# Patient Record
Sex: Female | Born: 1990 | Hispanic: No | Marital: Married | Smoking: Never smoker
Health system: Southern US, Community
[De-identification: ages and names within clinical notes are randomized; demographics above are authoritative.]

## PROBLEM LIST (undated history)

## (undated) DIAGNOSIS — J45909 Unspecified asthma, uncomplicated: Secondary | ICD-10-CM

## (undated) DIAGNOSIS — R51 Headache: Secondary | ICD-10-CM

## (undated) HISTORY — DX: Headache: R51

## (undated) HISTORY — PX: TONSILLECTOMY: SUR1361

## (undated) HISTORY — PX: TONSILECTOMY, ADENOIDECTOMY, BILATERAL MYRINGOTOMY AND TUBES: SHX2538

---
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
## 2012-07-22 ENCOUNTER — Encounter (HOSPITAL_COMMUNITY): Payer: Self-pay | Admitting: Emergency Medicine

## 2012-07-22 ENCOUNTER — Emergency Department (HOSPITAL_COMMUNITY)
Admission: EM | Admit: 2012-07-22 | Discharge: 2012-07-22 | Disposition: A | Discharge status: Home / Self Care | Payer: BC Managed Care – PPO | Attending: Emergency Medicine | Admitting: Emergency Medicine

## 2012-07-22 DIAGNOSIS — R05 Cough: Secondary | ICD-10-CM | POA: Insufficient documentation

## 2012-07-22 DIAGNOSIS — J45901 Unspecified asthma with (acute) exacerbation: Secondary | ICD-10-CM | POA: Insufficient documentation

## 2012-07-22 DIAGNOSIS — J3489 Other specified disorders of nose and nasal sinuses: Secondary | ICD-10-CM | POA: Insufficient documentation

## 2012-07-22 DIAGNOSIS — R059 Cough, unspecified: Secondary | ICD-10-CM | POA: Insufficient documentation

## 2012-07-22 HISTORY — DX: Unspecified asthma, uncomplicated: J45.909

## 2012-07-22 MED ORDER — ALBUTEROL SULFATE (5 MG/ML) 0.5% IN NEBU
5.0000 mg | INHALATION_SOLUTION | Freq: Once | RESPIRATORY_TRACT | Status: AC
  Administered 2012-07-22: 5 mg via RESPIRATORY_TRACT
  Filled 2012-07-22: qty 1

## 2012-07-22 MED ORDER — ALBUTEROL SULFATE (5 MG/ML) 0.5% IN NEBU
5.0000 mg | INHALATION_SOLUTION | RESPIRATORY_TRACT | Status: AC
  Administered 2012-07-22 (×2): 5 mg via RESPIRATORY_TRACT
  Filled 2012-07-22 (×2): qty 1

## 2012-07-22 MED ORDER — IPRATROPIUM BROMIDE 0.02 % IN SOLN
0.5000 mg | Freq: Once | RESPIRATORY_TRACT | Status: AC
  Administered 2012-07-22: 0.5 mg via RESPIRATORY_TRACT
  Filled 2012-07-22: qty 2.5

## 2012-07-22 MED ORDER — PREDNISONE 20 MG PO TABS
60.0000 mg | ORAL_TABLET | Freq: Once | ORAL | Status: AC
  Administered 2012-07-22: 60 mg via ORAL
  Filled 2012-07-22: qty 3

## 2012-07-22 MED ORDER — ALPRAZOLAM 0.25 MG PO TABS
0.2500 mg | ORAL_TABLET | Freq: Once | ORAL | Status: AC
  Administered 2012-07-22: 0.25 mg via ORAL
  Filled 2012-07-22: qty 1

## 2012-07-22 MED ORDER — PREDNISONE 20 MG PO TABS: 40.0000 mg | ORAL_TABLET | Freq: Every day | ORAL | Status: DC

## 2012-07-22 MED ORDER — ALBUTEROL SULFATE HFA 108 (90 BASE) MCG/ACT IN AERS: 2.0000 | INHALATION_SPRAY | RESPIRATORY_TRACT | Status: DC | PRN

## 2012-07-22 NOTE — ED Notes (Signed)
JYN:[id number]<AO> Expected date:<BR> Expected time:<BR> Means of arrival:Ambulance<BR> Comments:<BR> asthma

## 2012-07-22 NOTE — ED Notes (Addendum)
PER EMS: Pt at school near cooking ovens, started hyperventilating. Cold since last Wednesday. Lungs clear no wheezing

## 2012-07-22 NOTE — ED Provider Notes (Signed)
History     CSN: [id number]  Arrival date & time 07/22/12  0902   First MD Initiated Contact with Patient 07/22/12 [phone]      Chief Complaint  Patient presents with  . Shortness of Breath    (Consider location/radiation/quality/duration/timing/severity/associated sxs/prior treatment) The history is provided by the patient.  Shelly [name] is a 21 y.o. female history of asthma here with shortness of breath. She has been having some nonproductive cough and sinus congestion for last 4 days. Today she was at [employer] school and was cooking and felt SOB. She felt that she couldn't breath. No hx of anxiety attacks. She felt like it was her asthma. Didn't have albuterol with her so she didn't use it. No hx of DVT/PE and no recent travels.    Past Medical History  Diagnosis Date  . Asthma     Past Surgical History  Procedure Date  . Tonsilectomy, adenoidectomy, bilateral myringotomy and tubes     No family history on file.  History  Substance Use Topics  . Smoking status: Never Smoker   . Smokeless tobacco: Not on file  . Alcohol Use: No    OB History    Grav Para Term Preterm Abortions TAB SAB Ect Mult Living                  Review of Systems  HENT: Positive for congestion and rhinorrhea.   Respiratory: Positive for cough.   All other systems reviewed and are negative.    Allergies  Review of patient's allergies indicates no known allergies.  Home Medications  No current outpatient prescriptions on file.  BP 112/69  Pulse 72  Temp 98 F (36.7 C) (Oral)  SpO2 100%  LMP 06/30/2012  Physical Exam  Nursing note and vitals reviewed. Constitutional: She is oriented to person, place, and time. She appears well-developed and well-nourished.       Calm   HENT:  Head: Normocephalic.  Mouth/Throat: Oropharynx is clear and moist.  Eyes: Conjunctivae normal are normal. Pupils are equal, round, and reactive to light.  Neck: Normal range of motion. Neck supple.   Cardiovascular: Normal rate, regular rhythm and normal heart sounds.   Pulmonary/Chest: Effort normal.       Slightly decreased air movements, no obvious wheezing. No retractions.   Abdominal: Soft. Bowel sounds are normal. She exhibits no distension. There is no tenderness. There is no rebound.  Musculoskeletal: Normal range of motion.  Neurological: She is alert and oriented to person, place, and time.  Skin: Skin is warm and dry.  Psychiatric: She has a normal mood and affect. Her behavior is normal. Judgment and thought content normal.    ED Course  Procedures (including critical care time)  Labs Reviewed - No data to display No results found.   No diagnosis found.   Date: 07/22/2012  Rate: 78  Rhythm: normal sinus rhythm  QRS Axis: normal  Intervals: borderline prolonged QT  ST/T Wave abnormalities: normal  Conduction Disutrbances:none  Narrative Interpretation:   Old EKG Reviewed: none available    MDM  Shelly [name] is a 20 y.o. female here with SOB likely mild asthma exacerbation. Will give a neb and reassess.   9:56 AM Still not feeling well. Will give steroids and albuterol.   10:58 AM Felt better, improved air movements. No wheezing. Will d/c home on course of prednisone and albuterol.       Richardean Canal, MD 07/22/12 1058

## 2013-01-24 ENCOUNTER — Emergency Department (HOSPITAL_COMMUNITY)
Admission: EM | Admit: 2013-01-24 | Discharge: 2013-01-25 | Disposition: A | Discharge status: Home / Self Care | Payer: Worker's Compensation | Attending: Emergency Medicine | Admitting: Emergency Medicine

## 2013-01-24 ENCOUNTER — Encounter (HOSPITAL_COMMUNITY): Payer: Self-pay | Admitting: *Deleted

## 2013-01-24 ENCOUNTER — Emergency Department (HOSPITAL_COMMUNITY): Payer: Worker's Compensation

## 2013-01-24 DIAGNOSIS — S8990XA Unspecified injury of unspecified lower leg, initial encounter: Secondary | ICD-10-CM | POA: Insufficient documentation

## 2013-01-24 DIAGNOSIS — Y9229 Other specified public building as the place of occurrence of the external cause: Secondary | ICD-10-CM | POA: Insufficient documentation

## 2013-01-24 DIAGNOSIS — Z79899 Other long term (current) drug therapy: Secondary | ICD-10-CM | POA: Insufficient documentation

## 2013-01-24 DIAGNOSIS — J45909 Unspecified asthma, uncomplicated: Secondary | ICD-10-CM | POA: Insufficient documentation

## 2013-01-24 DIAGNOSIS — Y990 Civilian activity done for income or pay: Secondary | ICD-10-CM | POA: Insufficient documentation

## 2013-01-24 DIAGNOSIS — W010XXA Fall on same level from slipping, tripping and stumbling without subsequent striking against object, initial encounter: Secondary | ICD-10-CM | POA: Insufficient documentation

## 2013-01-24 DIAGNOSIS — IMO0002 Reserved for concepts with insufficient information to code with codable children: Secondary | ICD-10-CM | POA: Insufficient documentation

## 2013-01-24 DIAGNOSIS — S8992XA Unspecified injury of left lower leg, initial encounter: Secondary | ICD-10-CM

## 2013-01-24 MED ORDER — HYDROCODONE-ACETAMINOPHEN 5-325 MG PO TABS: 1.0000 | ORAL_TABLET | Freq: Four times a day (QID) | ORAL | Status: DC | PRN

## 2013-01-24 MED ORDER — IBUPROFEN 800 MG PO TABS: 800.0000 mg | ORAL_TABLET | Freq: Three times a day (TID) | ORAL | Status: DC | PRN

## 2013-01-24 NOTE — ED Provider Notes (Signed)
History  This chart was scribed for Ebbie Ridge, PA-C by Ladona Ridgel Day, ED scribe. This patient was seen in room [facility]TR4/[facility]PT4 and the patient's care was started at 2135.   CSN: [id number]  Arrival date & time 01/24/13  2135   First MD Initiated Contact with Patient 01/24/13 2247      Chief Complaint  Patient presents with  . Knee Injury   The history is provided by the patient. No language interpreter was used.   HPI Comments: Shelly [name] is a 22 y.o. female who presents to the Emergency Department complaining of acute onset moderate left lower leg pain from knee to ankle after she slipped on water while at work this PM about 2 hours ago. She denies any other injuries.    Past Medical History  Diagnosis Date  . Asthma     Past Surgical History  Procedure Laterality Date  . Tonsilectomy, adenoidectomy, bilateral myringotomy and tubes      History reviewed. No pertinent family history.  History  Substance Use Topics  . Smoking status: Never Smoker   . Smokeless tobacco: Not on file  . Alcohol Use: No    OB History   Grav Para Term Preterm Abortions TAB SAB Ect Mult Living                  Review of Systems  Constitutional: Negative for fever and chills.  Respiratory: Negative for shortness of breath.   Gastrointestinal: Negative for nausea and vomiting.  Musculoskeletal:       Left lower leg pain  Neurological: Negative for weakness.  All other systems reviewed and are negative.   A complete 10 system review of systems was obtained and all systems are negative except as noted in the HPI and PMH.   Allergies  Review of patient's allergies indicates no known allergies.  Home Medications   Current Outpatient Rx  Name  Route  Sig  Dispense  Refill  . desogestrel-ethinyl estradiol (VIORELE) 0.15-0.02/0.01 MG (21/5) tablet   Oral   Take 1 tablet by mouth daily.         . mometasone (ASMANEX 120 METERED DOSES) 220 MCG/INH inhaler   Inhalation   Inhale 2  puffs into the lungs every other day.         . albuterol (PROVENTIL HFA;VENTOLIN HFA) 108 (90 BASE) MCG/ACT inhaler   Inhalation   Inhale 2 puffs into the lungs every 4 (four) hours as needed for wheezing.   1 Inhaler   0     Triage Vitals: BP 121/63  Pulse 73  Temp(Src) 98.3 F (36.8 C) (Oral)  Resp 20  Wt 201 lb 1.6 oz (91.218 kg)  SpO2 97%  LMP 01/08/2013  Physical Exam  Nursing note and vitals reviewed. Constitutional: She is oriented to person, place, and time. She appears well-developed and well-nourished. No distress.  HENT:  Head: Normocephalic and atraumatic.  Eyes: Pupils are equal, round, and reactive to light.  Pulmonary/Chest: Effort normal.  Musculoskeletal:       Left knee: She exhibits normal range of motion, no swelling, no effusion, no ecchymosis and no deformity. Tenderness found.       Left ankle: She exhibits normal range of motion, no swelling and no ecchymosis. Tenderness. Lateral malleolus and medial malleolus tenderness found.       Legs: Neurological: She is alert and oriented to person, place, and time.  Skin: Skin is warm and dry.    ED Course  Procedures (including  critical care time) DIAGNOSTIC STUDIES: Oxygen Saturation is 97% on room air, normal by my interpretation.    COORDINATION OF CARE: At 1045 PM Discussed treatment plan with patient which includes left tib/fib X-ray. Patient agrees.   Patient has negative films. Told to return here as needed. Ice and elevate the leg.   MDM  I personally performed the services described in this documentation, which was scribed in my presence. The recorded information has been reviewed and is accurate.          Carlyle Dolly, PA-C 01/24/13 2351

## 2013-01-24 NOTE — ED Notes (Signed)
Pt was at work and slipped in water injuring her left knee, the pain is 7/10 and it hurts all way to her ankle.  Pt is alert and oriented in NAD

## 2013-01-25 NOTE — ED Provider Notes (Signed)
Medical screening examination/treatment/procedure(s) were performed by non-physician practitioner and as supervising physician I was immediately available for consultation/collaboration.  Ethelda Chick, MD 01/25/13 0005

## 2013-08-16 ENCOUNTER — Encounter (HOSPITAL_COMMUNITY): Payer: Self-pay | Admitting: Emergency Medicine

## 2013-08-16 ENCOUNTER — Emergency Department (HOSPITAL_COMMUNITY): Payer: BC Managed Care – PPO

## 2013-08-16 ENCOUNTER — Emergency Department (HOSPITAL_COMMUNITY)
Admission: EM | Admit: 2013-08-16 | Discharge: 2013-08-17 | Disposition: A | Discharge status: Home / Self Care | Payer: BC Managed Care – PPO | Attending: Emergency Medicine | Admitting: Emergency Medicine

## 2013-08-16 DIAGNOSIS — Z791 Long term (current) use of non-steroidal anti-inflammatories (NSAID): Secondary | ICD-10-CM | POA: Insufficient documentation

## 2013-08-16 DIAGNOSIS — H538 Other visual disturbances: Secondary | ICD-10-CM

## 2013-08-16 DIAGNOSIS — H53149 Visual discomfort, unspecified: Secondary | ICD-10-CM | POA: Insufficient documentation

## 2013-08-16 DIAGNOSIS — J329 Chronic sinusitis, unspecified: Secondary | ICD-10-CM

## 2013-08-16 DIAGNOSIS — R51 Headache: Secondary | ICD-10-CM | POA: Insufficient documentation

## 2013-08-16 DIAGNOSIS — J45909 Unspecified asthma, uncomplicated: Secondary | ICD-10-CM | POA: Insufficient documentation

## 2013-08-16 DIAGNOSIS — T368X5A Adverse effect of other systemic antibiotics, initial encounter: Secondary | ICD-10-CM | POA: Insufficient documentation

## 2013-08-16 DIAGNOSIS — Z792 Long term (current) use of antibiotics: Secondary | ICD-10-CM | POA: Insufficient documentation

## 2013-08-16 LAB — POCT I-STAT, CHEM 8
BUN: 8 mg/dL (ref 6–23)
Calcium, Ion: 1.15 mmol/L (ref 1.12–1.23)
Chloride: 103 mEq/L (ref 96–112)
Creatinine, Ser: 1 mg/dL (ref 0.50–1.10)
Glucose, Bld: 92 mg/dL (ref 70–99)
HCT: 42 % (ref 36.0–46.0)
Hemoglobin: 14.3 g/dL (ref 12.0–15.0)
POTASSIUM: 3.9 meq/L (ref 3.7–5.3)
SODIUM: 140 meq/L (ref 137–147)
TCO2: 25 mmol/L (ref 0–100)

## 2013-08-16 MED ORDER — IOHEXOL 300 MG/ML  SOLN
100.0000 mL | Freq: Once | INTRAMUSCULAR | Status: AC | PRN
  Administered 2013-08-16: 100 mL via INTRAVENOUS

## 2013-08-16 MED ORDER — SODIUM CHLORIDE 0.9 % IV BOLUS (SEPSIS)
1000.0000 mL | Freq: Once | INTRAVENOUS | Status: AC
  Administered 2013-08-16: 1000 mL via INTRAVENOUS

## 2013-08-16 MED ORDER — AMOXICILLIN-POT CLAVULANATE 875-125 MG PO TABS: 1.0000 | ORAL_TABLET | Freq: Two times a day (BID) | ORAL | Status: DC

## 2013-08-16 NOTE — Discharge Instructions (Signed)
Please read and follow all provided instructions.  Your diagnoses today include:  1. Blurred vision, bilateral   2. Sinusitis     Tests performed today include:  Visual acuity testing to check your vision  CT scan to evaluate for any structural problems with the eye -- shows normal eyes, shows sinus infection  Vital signs. See below for your results today.   Medications prescribed:   Augmentin - antibiotic  You have been prescribed an antibiotic medicine: take the entire course of medicine even if you are feeling better. Stopping early can cause the antibiotic not to work.  Take any prescribed medications only as directed.  Home care instructions:  Follow any educational materials contained in this packet.   Follow-up instructions: Please follow-up with the opthalmologist listed in the next 2-3 days for further evaluation of your symptoms.  If you do not have a primary care doctor -- see below for referral information.   Return instructions:   Please return to the Emergency Department if you experience worsening symptoms.   Please return immediately if you develop severe pain, pus drainage, new change in vision, or fever.  Return with loss of vision or black areas in your vision  Please return if you have any other emergent concerns.  Additional Information:  Your vital signs today were: BP 133/71   Pulse 74   Temp(Src) 98.3 F (36.8 C) (Oral)   Resp 18   SpO2 100%   LMP 07/21/2013 If your blood pressure (BP) was elevated above 135/85 this visit, please have this repeated by your doctor within one month. ---------------

## 2013-08-16 NOTE — ED Provider Notes (Signed)
CSN: [id number]     Arrival date & time 08/16/13  1927 History   First MD Initiated Contact with Patient 08/16/13 1938     Chief Complaint  Patient presents with  . Allergic Reaction   (Consider location/radiation/quality/duration/timing/severity/associated sxs/prior Treatment) HPI Comments: Patient with no significant PMH presents with complaint of blurred vision, change in vision describing that everything "looks bright". Vision is changed in both eyes but worse in right. Patient states that she was given a prescription for levofloxacin for sinus infection 3 days ago. She has also been taking over-the-counter cold medication, naproxen, and a generic "migraine medication". She has had associated headaches which currently is nearly gone. She denies double vision, floaters, flashing lights. She denies hitting her head. She is a contact lens wearer. She took them out earlier but symptoms did not change. The onset of this condition was gradual. The course is gradually worsening. Aggravating factors: light. Alleviating factors: none.    Patient is a 23 y.o. female presenting with allergic reaction.  Allergic Reaction Presenting symptoms: no rash     Past Medical History  Diagnosis Date  . Asthma    Past Surgical History  Procedure Laterality Date  . Tonsilectomy, adenoidectomy, bilateral myringotomy and tubes     No family history on file. History  Substance Use Topics  . Smoking status: Never Smoker   . Smokeless tobacco: Not on file  . Alcohol Use: No   OB History   Grav Para Term Preterm Abortions TAB SAB Ect Mult Living                 Review of Systems  Constitutional: Negative for fever.  HENT: Positive for congestion. Negative for rhinorrhea and sore throat.   Eyes: Positive for photophobia and visual disturbance. Negative for pain, discharge, redness and itching.  Respiratory: Negative for cough.   Cardiovascular: Negative for chest pain.  Gastrointestinal: Negative for  nausea, vomiting, abdominal pain and diarrhea.  Genitourinary: Negative for dysuria.  Musculoskeletal: Negative for myalgias.  Skin: Negative for rash.  Neurological: Positive for headaches.    Allergies  Review of patient's allergies indicates no known allergies.  Home Medications   Current Outpatient Rx  Name  Route  Sig  Dispense  Refill  . desogestrel-ethinyl estradiol (VIORELE) 0.15-0.02/0.01 MG (21/5) tablet   Oral   Take 1 tablet by mouth daily.         . diphenhydrAMINE (SOMINEX) 25 MG tablet   Oral   Take 25 mg by mouth at bedtime as needed for sleep.         Marland Kitchen levofloxacin (LEVAQUIN) 500 MG tablet   Oral   Take 500 mg by mouth daily.         . naproxen (NAPROSYN) 500 MG tablet   Oral   Take 500 mg by mouth 2 (two) times daily with a meal.         . Pseudoeph-Doxylamine-DM-APAP (NYQUIL PO)   Oral   Take 30 mLs by mouth at bedtime.          BP 133/71  Pulse 74  Temp(Src) 98.3 F (36.8 C) (Oral)  Resp 18  SpO2 100% Physical Exam  Nursing note and vitals reviewed. Constitutional: She appears well-developed and well-nourished.  HENT:  Head: Normocephalic and atraumatic.  Right Ear: External ear normal.  Left Ear: External ear normal.  Nose: Nose normal.  Mouth/Throat: Oropharynx is clear and moist.  Eyes: Conjunctivae, EOM and lids are normal. Right eye exhibits no discharge.  Left eye exhibits no discharge. Right conjunctiva is not injected. Right conjunctiva has no hemorrhage. Left conjunctiva is not injected. Left conjunctiva has no hemorrhage. Right eye exhibits normal extraocular motion and no nystagmus. Left eye exhibits normal extraocular motion and no nystagmus.  Fundoscopic exam:      The right eye shows no arteriolar narrowing, no AV nicking, no exudate, no hemorrhage and no papilledema.       The left eye shows no arteriolar narrowing, no AV nicking, no exudate, no hemorrhage and no papilledema.  Pupils dilated bilaterally and reactive to  light. Funduscopic exam normal.   Neck: Normal range of motion. Neck supple.  Cardiovascular: Normal rate, regular rhythm and normal heart sounds.   Pulmonary/Chest: Effort normal and breath sounds normal.  Abdominal: Soft. There is no tenderness.  Neurological: She is alert.  Skin: Skin is warm and dry.  Psychiatric: She has a normal mood and affect.    ED Course  Procedures (including critical care time) Labs Review Labs Reviewed  POCT I-STAT, CHEM 8   Imaging Review Ct Maxillofacial W/cm  08/16/2013   CLINICAL DATA:  Right-sided facial pressure with blurred vision and congestion.  EXAM: CT MAXILLOFACIAL WITH CONTRAST  TECHNIQUE: Multidetector CT imaging of the maxillofacial structures was performed with intravenous contrast. Multiplanar CT image reconstructions were also generated. A small metallic BB was placed on the right temple in order to reliably differentiate right from left.  CONTRAST:  100 mL OMNIPAQUE IOHEXOL 300 MG/ML  SOLN  COMPARISON:  None.  FINDINGS: The globes are intact and the lenses are located. Orbital fat appears normal. Visualized intracranial contents are unremarkable. Scattered ethmoid air cell disease is identified. There is mucosal thickening in the maxillary sinuses bilaterally with some bubbly secretions in the right maxillary sinus. Minimal mucosal thickening is seen in the right sphenoid sinus. The mastoid air cells and middle ears are clear. Facial bones are intact and normal in appearance. No dental disease identified. The mandibular condyles are located. Upper cervical spine appears normal. Major caliber vascular structures appear normal. Imaged intracranial contents appear normal.  IMPRESSION: Ethmoid air cell and bilateral maxillary sinus disease. The study is otherwise negative.   Electronically Signed   By: Drusilla Kanner M.D.   On: 08/16/2013 22:39    EKG Interpretation   None      7:56 PM Patient seen and examined. Work-up initiated.   Vital  signs reviewed and are as follows: Filed Vitals:   08/16/13 1939  BP: 133/71  Pulse: 74  Temp: 98.3 F (36.8 C)  Resp: 18   8:35 PM Patient discussed with and seen by Dr. Freida Busman. CT maxillofacial ordered to eval sinuses and orbits.   11:38 PM CT negative except for sinusitis. Visual acuity rechecked and improved but still abnormal. Exam remains unchanged. I spoke with Dr. Burgess Estelle and we discussed exam, CT findings.   No emergent conditions suspected. Patient to f/u with Dr. Burgess Estelle next week, return with worsening. Including worsening eye pain, loss of vision, dark spots in vision. Discussed this plan with patient and parent. Patient verbalizes understanding and agrees with plan.   Will switch from levofloxacin to augmentin for sinusitis. Will have patient avoid OTC cold meds.   MDM   1. Blurred vision, bilateral   2. Sinusitis    Patient with blurry vision, sinusitis. Exam is normal and unconcerning. No other neuro deficits on exam. No foreign bodies noted. No surrounding erythema, swelling, vision changes/loss suspicious for orbital or periorbital cellulitis. No  signs of iritis. No signs of glaucoma. No symptoms of retinal detachment. CT performed and shows sinusitis. However, corrected visual acuity is abnormal and this would not be suspected with sinusitis. Pupils are somewhat dilated but reactive. Question if she has iatrogenic dilation from OTC meds/benadryl causing sight problems. Regardless, no ophthalmologic emergency suspected. Outpatient referral given in case of no improvement. Return instructions given.     Renne CriglerJoshua Remonia Otte, PA-C 08/16/13 2349

## 2013-08-16 NOTE — ED Provider Notes (Addendum)
Medical screening examination/treatment/procedure(s) were performed by non-physician practitioner and as supervising physician I was immediately available for consultation/collaboration.  EKG Interpretation   None      Pt with complaints of eye pain and decreased vision. Describes pressure behind the orbits. No eye drainage or pruritus. Eyes not red , no drainage, sclera nl--cornea nl Patient recently diagnosed with sinusitis clinically Will obtain facial CT and likely of mouth tonsil.  11:29 PM pts visual acuity improved, spoke with opth, dr Burgess Estelletanner, no futher w/u needed--head ct w/ sinusitis, will switch to augmentin and d/c use of benadryl  Toy BakerAnthony T Britiany Silbernagel, MD 08/16/13 2236  Toy BakerAnthony T Adriona Kaney, MD 08/16/13 2330

## 2013-08-16 NOTE — ED Notes (Signed)
Pt arrived to ED with a complaint of an allergic reaction.  Pt believes that the reaction is causes by a recent dosage of antibiotic.  Pt states reaction is a blurred vision with photosensitivity.

## 2013-08-17 ENCOUNTER — Emergency Department (HOSPITAL_COMMUNITY)
Admission: EM | Admit: 2013-08-17 | Discharge: 2013-08-17 | Disposition: A | Discharge status: Home / Self Care | Payer: BC Managed Care – PPO | Attending: Emergency Medicine | Admitting: Emergency Medicine

## 2013-08-17 ENCOUNTER — Encounter (HOSPITAL_COMMUNITY): Payer: Self-pay | Admitting: Emergency Medicine

## 2013-08-17 ENCOUNTER — Emergency Department (HOSPITAL_COMMUNITY): Payer: BC Managed Care – PPO

## 2013-08-17 DIAGNOSIS — J019 Acute sinusitis, unspecified: Secondary | ICD-10-CM | POA: Insufficient documentation

## 2013-08-17 DIAGNOSIS — J45909 Unspecified asthma, uncomplicated: Secondary | ICD-10-CM | POA: Insufficient documentation

## 2013-08-17 DIAGNOSIS — R519 Headache, unspecified: Secondary | ICD-10-CM

## 2013-08-17 DIAGNOSIS — Z791 Long term (current) use of non-steroidal anti-inflammatories (NSAID): Secondary | ICD-10-CM | POA: Insufficient documentation

## 2013-08-17 DIAGNOSIS — R51 Headache: Secondary | ICD-10-CM | POA: Insufficient documentation

## 2013-08-17 DIAGNOSIS — H539 Unspecified visual disturbance: Secondary | ICD-10-CM

## 2013-08-17 DIAGNOSIS — Z79899 Other long term (current) drug therapy: Secondary | ICD-10-CM | POA: Insufficient documentation

## 2013-08-17 LAB — BASIC METABOLIC PANEL
BUN: 7 mg/dL (ref 6–23)
CALCIUM: 9.6 mg/dL (ref 8.4–10.5)
CHLORIDE: 102 meq/L (ref 96–112)
CO2: 22 meq/L (ref 19–32)
CREATININE: 0.75 mg/dL (ref 0.50–1.10)
GFR calc Af Amer: >90 mL/min (ref >90)
GFR calc non Af Amer: >90 mL/min (ref >90)
Glucose, Bld: 94 mg/dL (ref 70–99)
Potassium: 3.7 mEq/L (ref 3.7–5.3)
Sodium: 138 mEq/L (ref 137–147)

## 2013-08-17 LAB — CBC
HCT: 41.5 % (ref 36.0–46.0)
HEMOGLOBIN: 14.1 g/dL (ref 12.0–15.0)
MCH: 27 pg (ref 26.0–34.0)
MCHC: 34 g/dL (ref 30.0–36.0)
MCV: 79.3 fL (ref 78.0–100.0)
Platelets: 192 10*3/uL (ref 150–400)
RBC: 5.23 MIL/uL — AB (ref 3.87–5.11)
RDW: 13.8 % (ref 11.5–15.5)
WBC: 4.4 10*3/uL (ref 4.0–10.5)

## 2013-08-17 MED ORDER — SODIUM CHLORIDE 0.9 % IV BOLUS (SEPSIS)
1000.0000 mL | Freq: Once | INTRAVENOUS | Status: AC
  Administered 2013-08-17: 1000 mL via INTRAVENOUS

## 2013-08-17 MED ORDER — TETRACAINE HCL 0.5 % OP SOLN
2.0000 [drp] | Freq: Once | OPHTHALMIC | Status: AC
  Administered 2013-08-17: 2 [drp] via OPHTHALMIC
  Filled 2013-08-17: qty 2

## 2013-08-17 MED ORDER — DEXAMETHASONE SODIUM PHOSPHATE 10 MG/ML IJ SOLN
10.0000 mg | Freq: Once | INTRAMUSCULAR | Status: AC
  Administered 2013-08-17: 10 mg via INTRAVENOUS
  Filled 2013-08-17: qty 1

## 2013-08-17 MED ORDER — KETOROLAC TROMETHAMINE 30 MG/ML IJ SOLN
30.0000 mg | Freq: Once | INTRAMUSCULAR | Status: AC
  Administered 2013-08-17: 30 mg via INTRAVENOUS
  Filled 2013-08-17: qty 1

## 2013-08-17 MED ORDER — DIPHENHYDRAMINE HCL 50 MG/ML IJ SOLN
25.0000 mg | Freq: Once | INTRAMUSCULAR | Status: AC
  Administered 2013-08-17: 25 mg via INTRAVENOUS
  Filled 2013-08-17: qty 1

## 2013-08-17 MED ORDER — METOCLOPRAMIDE HCL 5 MG/ML IJ SOLN
10.0000 mg | Freq: Once | INTRAMUSCULAR | Status: AC
  Administered 2013-08-17: 10 mg via INTRAVENOUS
  Filled 2013-08-17: qty 2

## 2013-08-17 MED ORDER — LORAZEPAM 1 MG PO TABS
1.0000 mg | ORAL_TABLET | Freq: Once | ORAL | Status: AC
  Administered 2013-08-17: 1 mg via ORAL
  Filled 2013-08-17: qty 1

## 2013-08-17 MED ORDER — SUMATRIPTAN SUCCINATE 6 MG/0.5ML ~~LOC~~ SOLN
6.0000 mg | Freq: Once | SUBCUTANEOUS | Status: AC
  Administered 2013-08-17: 6 mg via SUBCUTANEOUS
  Filled 2013-08-17: qty 0.5

## 2013-08-17 NOTE — ED Notes (Signed)
Pt was here yesterday and was dx w/ bilateral sinusitis and blurred vision.  Pt was told to come back if it got any worse.  States that things look darker and there are "blank spots".  C/o headache related to her sinus infection.

## 2013-08-17 NOTE — ED Provider Notes (Signed)
Medical screening examination/treatment/procedure(s) were conducted as a shared visit with non-physician practitioner(s) and myself.  I personally evaluated the patient during the encounter.  EKG Interpretation   None       23 yo female with headache and vision changes.  She reports bilateral visual field deficits.  On exam, nontoxic, well appearing.  Visual field cuts on right and inferior fields in both eyes. Pupils equal and reactive.  MR obtained and negative for lesion, stroke, or thrombosis.  Plan treatment for headache (?complicated migraine).  She also reports these symptoms started when she started taking levaquin for 3 days of sinus congestion.  Unclear if this is cause of her symptoms, but she is worried they are related.  Her sinusitis is likely viral given time course.  I think it is reasonable to stop this medication due to possible atypical medication effect.  She will follow up with PCP and ophthalmology.    Clinical Impression: 1. Headache   2. Visual disturbance       Candyce ChurnJohn David Avyanna Spada, MD 08/18/13 0025

## 2013-08-17 NOTE — ED Notes (Signed)
Per pt, was here yesterday-states vision changes today, different than yesterday-sees dark spots-still having headache

## 2013-08-17 NOTE — ED Notes (Signed)
Pt returned from MRI °

## 2013-08-17 NOTE — ED Provider Notes (Signed)
CSN: [id number]     Arrival date & time 08/17/13  1340 History   None    Chief Complaint  Patient presents with  . Blurred Vision  . Headache  . Sinusitis   (Consider location/radiation/quality/duration/timing/severity/associated sxs/prior Treatment) The history is provided by the patient and medical records. No language interpreter was used.    Shelly [name] is a 23 y.o. female  with a hx of asthma presents to the Emergency Department complaining of gradual, persistent, progressively worsening visual changes onset yesterday morning.  Patient reports she was diagnosed with sinusitis on 08/14/2012 port she began taking Levaquin. She woke yesterday morning complaining of everything "looking right with blurriness."  She reports she was discharged with a diagnosis of sinusitis and given a new antibiotic, Augmentin. She reports she was told to return if symptoms change. When she awoke this morning she reports that everything "looks dark and is still blurry.". She denies double vision, floaters and flashing lights. She denies falling, hitting her head or known trauma. She denies personal or family history of clotting disorders or CVA.  She reports the symptoms have been gradual and she denies any other neurologic symptoms such as numbness, tingling, gait disturbance, dizziness, weakness. She does endorse an associated headache. She states she's been taking over-the-counter naproxen and a generic migraine medicine similar to Excedrin Migraine. She denies fever, chills, neck pain, neck stiffness, rash. Nothing seems to make her symptoms better or worse.  Past Medical History  Diagnosis Date  . Asthma    Past Surgical History  Procedure Laterality Date  . Tonsilectomy, adenoidectomy, bilateral myringotomy and tubes     No family history on file. History  Substance Use Topics  . Smoking status: Never Smoker   . Smokeless tobacco: Not on file  . Alcohol Use: No   OB History   Grav Para Term  Preterm Abortions TAB SAB Ect Mult Living                 Review of Systems  Constitutional: Negative for fever, diaphoresis, appetite change, fatigue and unexpected weight change.  HENT: Negative for mouth sores.   Eyes: Positive for visual disturbance.  Respiratory: Negative for cough, chest tightness, shortness of breath and wheezing.   Cardiovascular: Negative for chest pain.  Gastrointestinal: Negative for nausea, vomiting, abdominal pain, diarrhea and constipation.  Endocrine: Negative for polydipsia, polyphagia and polyuria.  Genitourinary: Negative for dysuria, urgency, frequency and hematuria.  Musculoskeletal: Negative for back pain and neck stiffness.  Skin: Negative for rash.  Allergic/Immunologic: Negative for immunocompromised state.  Neurological: Positive for headaches. Negative for syncope and light-headedness.  Hematological: Does not bruise/bleed easily.  Psychiatric/Behavioral: Negative for sleep disturbance. The patient is not nervous/anxious.     Allergies  Review of patient's allergies indicates no known allergies.  Home Medications   Current Outpatient Rx  Name  Route  Sig  Dispense  Refill  . amoxicillin-clavulanate (AUGMENTIN) 875-125 MG per tablet   Oral   Take 1 tablet by mouth every 12 (twelve) hours.   14 tablet   0   . desogestrel-ethinyl estradiol (VIORELE) 0.15-0.02/0.01 MG (21/5) tablet   Oral   Take 1 tablet by mouth daily.         . diphenhydrAMINE (SOMINEX) 25 MG tablet   Oral   Take 25 mg by mouth at bedtime as needed for sleep.         Marland Kitchen levofloxacin (LEVAQUIN) 500 MG tablet   Oral   Take 500 mg  by mouth daily.         . naproxen (NAPROSYN) 500 MG tablet   Oral   Take 500 mg by mouth 2 (two) times daily with a meal.         . Pseudoeph-Doxylamine-DM-APAP (NYQUIL PO)   Oral   Take 30 mLs by mouth at bedtime.          BP 118/63  Pulse 87  Temp(Src) 98.2 F (36.8 C) (Oral)  Resp 16  SpO2 99%  LMP  07/21/2013 Physical Exam  Nursing note and vitals reviewed. Constitutional: She is oriented to person, place, and time. She appears well-developed and well-nourished. No distress.  Awake, alert, nontoxic appearance  HENT:  Head: Normocephalic and atraumatic.  Right Ear: Tympanic membrane, external ear and ear canal normal.  Left Ear: Tympanic membrane, external ear and ear canal normal.  Nose: Nose normal. No mucosal edema.  Mouth/Throat: Uvula is midline, oropharynx is clear and moist and mucous membranes are normal. Mucous membranes are not dry. No uvula swelling. No oropharyngeal exudate, posterior oropharyngeal edema, posterior oropharyngeal erythema or tonsillar abscesses.  Eyes: Conjunctivae, EOM and lids are normal. Pupils are equal, round, and reactive to light. Right eye exhibits no chemosis, no discharge, no exudate and no hordeolum. No foreign body present in the right eye. Left eye exhibits no chemosis, no discharge, no exudate and no hordeolum. No foreign body present in the left eye. Right conjunctiva is not injected. Right conjunctiva has no hemorrhage. Left conjunctiva is not injected. Left conjunctiva has no hemorrhage. No scleral icterus. Right eye exhibits no nystagmus. Left eye exhibits no nystagmus.  Fundoscopic exam:      The right eye shows no arteriolar narrowing, no AV nicking, no exudate and no papilledema.       The left eye shows no arteriolar narrowing, no AV nicking, no exudate, no hemorrhage and no papilledema.  Slit lamp exam:      The right eye shows no corneal abrasion, no corneal flare, no corneal ulcer, no foreign body, no hyphema and no anterior chamber bulge.       The left eye shows no corneal abrasion, no corneal flare, no corneal ulcer, no foreign body, no hyphema and no anterior chamber bulge.  Pupils are equal, round and reactive, nondilated No conjunctival injection Sclera normal Cornea normal No drainage  Visual Acuity: Bilateral Near: 20/800 R  Near: 20/800  Inferior and right sided peripheral field deficits bilaterally L Near: 20/800  Intraocular Pressures: L - 17 R - 18   Neck: Normal range of motion and full passive range of motion without pain. Neck supple. No spinous process tenderness and no muscular tenderness present. No rigidity. Normal range of motion present. No Brudzinski's sign and no Kernig's sign noted.  Full range of motion without pain No midline or paraspinal tenderness No nuchal rigidity or meningeal signs  Cardiovascular: Normal rate, regular rhythm, normal heart sounds and intact distal pulses.   No murmur heard. RRR No murmur  Pulmonary/Chest: Effort normal and breath sounds normal. No respiratory distress. She has no decreased breath sounds. She has no wheezes. She has no rhonchi. She has no rales.  Clear and equal breath sounds  Abdominal: Soft. Bowel sounds are normal. She exhibits no mass. There is no tenderness. There is no rebound and no guarding.  Soft and nontender  Musculoskeletal: Normal range of motion. She exhibits no edema.  Neurological: She is alert and oriented to person, place, and time. She exhibits normal muscle  tone. Coordination normal. GCS eye subscore is 4. GCS verbal subscore is 5. GCS motor subscore is 6.  Speech is clear and goal oriented, follows commands Major Cranial nerves without deficit, no facial droop Normal strength in upper and lower extremities bilaterally including dorsiflexion and plantar flexion, strong and equal grip strength Sensation normal to light and sharp touch Moves extremities without ataxia, coordination intact Normal finger to nose and rapid alternating movements Neg romberg, no pronator drift Normal gait and balance  Skin: Skin is warm and dry. She is not diaphoretic. No erythema.  Psychiatric: She has a normal mood and affect. Her behavior is normal.    ED Course  Procedures (including critical care time) Labs Review Labs Reviewed  CBC -  Abnormal; Notable for the following:    RBC 5.23 (*)    All other components within normal limits  BASIC METABOLIC PANEL   Imaging Review Mr Brain Wo Contrast  08/17/2013   CLINICAL DATA:  Blurred vision.  Headache.  Sinusitis.  EXAM: MRI HEAD WITHOUT CONTRAST  TECHNIQUE: Multiplanar, multiecho pulse sequences of the brain and surrounding structures were obtained without intravenous contrast.  COMPARISON:  CT with contrast done yesterday.  FINDINGS: The brain has a normal appearance on all pulse sequences without evidence of malformation, atrophy, old or acute infarction, mass lesion, hemorrhage, hydrocephalus or extra-axial collection. Arterial and venous structures show normal flow. There are inflammatory changes of the maxillary, ethmoid and sphenoid sinuses as demonstrated on CT.  IMPRESSION: Normal MRI of the brain. No cause of visual disturbance identified. No evidence of venous thrombosis on the parenchymal imaging.  Mucosal inflammation of the paranasal sinuses similar to yesterday's study.   Electronically Signed   By: Paulina Fusi M.D.   On: 08/17/2013 18:53   Ct Maxillofacial W/cm  08/16/2013   CLINICAL DATA:  Right-sided facial pressure with blurred vision and congestion.  EXAM: CT MAXILLOFACIAL WITH CONTRAST  TECHNIQUE: Multidetector CT imaging of the maxillofacial structures was performed with intravenous contrast. Multiplanar CT image reconstructions were also generated. A small metallic BB was placed on the right temple in order to reliably differentiate right from left.  CONTRAST:  100 mL OMNIPAQUE IOHEXOL 300 MG/ML  SOLN  COMPARISON:  None.  FINDINGS: The globes are intact and the lenses are located. Orbital fat appears normal. Visualized intracranial contents are unremarkable. Scattered ethmoid air cell disease is identified. There is mucosal thickening in the maxillary sinuses bilaterally with some bubbly secretions in the right maxillary sinus. Minimal mucosal thickening is seen in the  right sphenoid sinus. The mastoid air cells and middle ears are clear. Facial bones are intact and normal in appearance. No dental disease identified. The mandibular condyles are located. Upper cervical spine appears normal. Major caliber vascular structures appear normal. Imaged intracranial contents appear normal.  IMPRESSION: Ethmoid air cell and bilateral maxillary sinus disease. The study is otherwise negative.   Electronically Signed   By: Drusilla Kanner M.D.   On: 08/16/2013 22:39   Mr Mrv Head Wo Cm  08/17/2013   CLINICAL DATA:  Sinusitis.  Headache.  Visual disturbance.  EXAM: MR MRV HEAD WITHOUT CONTRAST  TECHNIQUE: Multiplanar, multisequence MR imaging was performed. No intravenous contrast was administered.  COMPARISON:  CT with contrast done yesterday. MRI of the brain done today.  FINDINGS: Sagittal sinus is patent and normal. Transverse sinuses are normal allowing for flow phenomenon. The right side is dominant. Jugular veins show flow. Deep veins are patent as is the straight sinus.  IMPRESSION: No evidence of venous thrombosis.   Electronically Signed   By: Paulina Fusi M.D.   On: 08/17/2013 18:54    EKG Interpretation   None       MDM   1. Headache   2. Visual disturbance      Shelly [name] presents with increased vision loss since yesterday.  Pt with compete vision loss on the inferior and right fields bilaterally and visual acuity of 20/800 corrected.  No increased IOP and no evidence of iritis.  CT head negative yesterday; will MRI brain.    7:30PM Patient MRI and MRV negative for CVA, ICH, sinus venous thrombosis, optic chiasm lesion or optic nerve lesion.  No findings on MRI to indicate source of vision disturbance. We'll treat headache with possibility of ocular migraine.  10:00PM Patient given migraine cocktail plus fluid with some improvement in headache but no improvement in her vision.     11:20 PM Pt given multiple treatments for her headache which she  reports has not helped. She denies any changes in her vision. Patient is alert, oriented, nontoxic and nonseptic appearing. She has an otherwise normal neurologic exam.  Patient to be discharged home with close followup with ophthalmology and neurology.  Recommended she see ophthalmology tomorrow for further evaluation and if that is normal contact neurology. Also recommend obtaining a primary care physician to continue with her care.    It has been determined that no acute conditions requiring further emergency intervention are present at this time. The patient/guardian have been advised of the diagnosis and plan. We have discussed signs and symptoms that warrant return to the ED, such as changes or worsening in symptoms.   Vital signs are stable at discharge.   BP 118/63  Pulse 87  Temp(Src) 98.2 F (36.8 C) (Oral)  Resp 16  SpO2 99%  LMP 07/21/2013  Patient/guardian has voiced understanding and agreed to follow-up with the PCP or specialist.  The patient was discussed with and seen by Dr. Loretha Stapler who agrees with the treatment plan.   Dahlia Client Jouri Threat, PA-C 08/17/13 [id number]

## 2013-08-17 NOTE — ED Notes (Signed)
Patient transported to MRI 

## 2013-08-17 NOTE — Discharge Instructions (Signed)
1. Medications: usual home medications including your Augmentin for her sinus infection 2. Treatment: rest, drink plenty of fluids,  3. Follow Up: Please followup with Dr. Burgess Estelleanner, ophthalmology for further evaluation of your blurry vision.   Also followup with your primary doctor for discussion of your diagnoses and further evaluation after today's visit; if you do not have a primary care doctor use the resource guide provided to find one;    Emergency Department Resource Guide 1) Find a Doctor and Pay Out of Pocket Although you won't have to find out who is covered by your insurance plan, it is a good idea to ask around and get recommendations. You will then need to call the office and see if the doctor you have chosen will accept you as a new patient and what types of options they offer for patients who are self-pay. Some doctors offer discounts or will set up payment plans for their patients who do not have insurance, but you will need to ask so you aren't surprised when you get to your appointment.  2) Contact Your Local Health Department Not all health departments have doctors that can see patients for sick visits, but many do, so it is worth a call to see if yours does. If you don't know where your local health department is, you can check in your phone book. The CDC also has a tool to help you locate your state's health department, and many state websites also have listings of all of their local health departments.  3) Find a Walk-in Clinic If your illness is not likely to be very severe or complicated, you may want to try a walk in clinic. These are popping up all over the country in pharmacies, drugstores, and shopping centers. They're usually staffed by nurse practitioners or physician assistants that have been trained to treat common illnesses and complaints. They're usually fairly quick and inexpensive. However, if you have serious medical issues or chronic medical problems, these are  probably not your best option.  No Primary Care Doctor: - Call Health Connect at  [phone] - they can help you locate a primary care doctor that  accepts your insurance, provides certain services, etc. - Physician Referral Service- [phone]  Chronic Pain Problems: Organization         Address  Phone   Notes  [facility]  [phone] Patients need to be referred by their primary care doctor.   Medication Assistance: Organization         Address  Phone   Notes  [facility] [address] [place], [place] [address] [phone] --Must be a resident of [place] [facility] [place] -- Must have NO insurance coverage whatsoever (no Medicaid/ Medicare, etc.) -- The pt. MUST have a primary care doctor that directs their care regularly and follows them in the community   MedAssist  [phone]   [employer]  [phone]    Agencies that provide inexpensive medical care: Organization         Address  Phone   Notes  [facility]  [phone]   [facility]    [phone]   [facility] [address] [place], [place] [address] [phone]   [facility] [address], [place] [phone]   [facility]anned Parenthood    [phone]) [phone]   [facility]    [phone]   Community  Health and [place]  [address], [place] Phone:  [phone], Fax:  [phone] Hours of Operation:  9 am - 6 pm, M-F.  Also accepts Medicaid/Medicare and self-pay.  [facility] for [address], [address], [place] Phone: [phone], Fax: [phone]. Hours of Operation:  8:30 am - 5:30 pm, M-F.  Also accepts Medicaid and self-pay.  [facility] [address], [place] Phone: [phone]   [place], [place], [place] [phone], Ext. [phone] Mondays & Thursdays:  7-9 AM.  First 15 patients are seen on a first come, first serve basis.    [place] Providers:  Organization         Address  Phone   Notes  [facility] [address], [place] [phone] Also accepts self-pay patients.  [facility] [address], [place]  [phone]   [place], [address], [place] [phone]   [facility] [address], [place] [phone]   Lucianne Lei [address], [place]   [phone] Only accepts [place] Access [place] patients after they have their name applied to their card.   Self-Pay (no insurance) in [facility]:  Organization         Address  Phone   Notes  Sickle Cell Patients, [facility] [place] [phone]   [facility] [place] [phone]   [facility] [place]  [place], [place], [place] [phone]   [facility]/Dr. Osei-Bonsu  [address] [place] or [address], [place] [phone] Phone number for both [place] and [place] locations is the same.  [facility] [address], [place] [phone]   [facility] [address] [place] or [address] [phone]   [facility] [address], [place] [phone], phone; [phone], fax Sees patients 1st and 3rd Saturday of every month.  Must not qualify for public or private insurance (i.e. Medicaid, Medicare, [place] Health Choice, Veterans' Benefits)  Household income should be no more than 200% of the poverty level The clinic cannot treat you if you are pregnant or think you are pregnant  Sexually transmitted diseases are not treated at the clinic.     Dental Care: Organization         Address  Phone  Notes  [facility] [phone] Accepts children up to age 43 who are enrolled in [place] or [place]; pregnant women with a Medicaid card; and children who have applied for Medicaid or [place] Health Choice, but were declined, whose parents can pay a reduced fee at time of service.  [facility] Department of [facility]  [address], [place] [phone] Accepts children up to age 76 who are enrolled in [place] or [place]; pregnant women with a Medicaid card; and children who have applied for Medicaid or [place] Health Choice, but were declined, whose parents can pay a reduced fee at time of service.  [facility] Adult [facility] Access PROGRAM  [phone]  [place], [place] [phone] Patients are seen by appointment only. Walk-ins are not accepted. [place] will see patients 69 years of age and older. Monday - Tuesday (8am-5pm) Most Wednesdays (8:30-5pm) $30 per visit, cash only  [facility] Adult Dental Access PROGRAM  [address], [facility] [phone] Patients are seen by appointment only. Walk-ins are not accepted. [place] will see patients 73 years of age and older. One Wednesday Evening (Monthly: Volunteer Based).  $30 per visit, cash only  [place]  [phone] for adults; Children under age 72, call Graduate Pediatric Dentistry at [phone]. Children aged 82-14, please call [phone] to request a pediatric application.  Dental services are provided in all areas of dental care including fillings, crowns and bridges, complete and partial dentures, implants, gum treatment, root canals, and extractions. Preventive care is also provided. Treatment is provided to both adults and children. Patients are selected via a lottery and there is often a waiting  list.   [facility] [address] [place]  [phone] [email or web address]   [facility] [address] [place], [place] [phone], Ext. [phone] Second and Fourth Thursday of each month, opens at 6:30 AM; Clinic ends at 9 AM.  Patients are seen on a first-come first-served basis, and a limited number are seen during each clinic.   [facility]  [address] Hillard Danker [place], [place] [phone]   Eligibility Requirements You must have lived in [place], [place], or [place] counties for at least the last three months.   You cannot be eligible for state or federal sponsored [employer], including [employer], [place], or [employer].   You generally cannot be eligible for healthcare insurance through your employer.    How to apply: Eligibility screenings are held every Tuesday and Wednesday afternoon from 1:00 pm until 4:00 pm. You do not need an appointment for the interview!  [facility] [address], [place], [place]   [place]  [place] Department  [place]  [phone]    Behavioral Health Resources in the Community: Intensive Outpatient Programs Organization         Address  Phone  Notes  [employer] [place]. [address], [place], [place] [phone]   [facility] [address], [place], [place]   ADS: Alcohol & Drug Svcs [address], [place], [place]   [place] [address]  [place], [place] or [phone]   Substance Abuse Resources Organization         Address  Phone  Notes  Alcohol and Drug Services  [phone]   [place]  [phone]   [facility]   Chinita Pester  [phone]   Residential & Outpatient Substance Abuse Program   6476280097   Psychological Services Organization         Address  Phone  Notes  [facility]  [place]  [phone]   [place] [address] [place] or [phone]    Mobile Crisis Teams Organization         Address  Phone  Notes  [facility], Mobile Crisis Care Unit  [phone]   [facility]ive Psychotherapeutic Services  [address] [place], [place]   Ivin Booty  [place] [phone]    Self-Help/Support Groups Organization         Address  Phone             Notes  [facility] [place] - variety of support groups  [place] Call for more information  Narcotics Anonymous (NA), [facility] [address], [employer] [place]  2 meetings at this location   Special educational needs teacher         Address  Phone  Notes  [facility]idential [facility] [place],    [place]  [phone]   [facility]  [address], [place] [address], [place], [place]   [place], [place] [phone] Admissions: 8am-3pm M-F  Incentives Substance [place] [address],    [place], [place] [phone]   [facility] [address] [place], [place], [place]   [facility] [address]  [place], [place]   [facility] Programs - Intensive Outpatient [address] Kristeen [address], [place], [place]   [facility] ([place].) [address].,  [place], [place] [phone] or [phone]   [facility] Treatment Services (RTS) [address] [place], [place] Accepts Medicaid  [facility] [address]  [place] [phone] Substance Abuse/Addiction Treatment   [facility] Organization          Address  Phone  Notes  [facility]  [phone]   Domenic Schwab, PhD [address] Arlis Porta [place], [place]   [phone] or [phone]   [place], [place] [phone]   [facility] [address] [place], [place] [phone] Insurance/Medicaid/sponsorship through [facility] and Families [address], [address] [place]                                    [place], [place] [phone] [place] [address], [place] [phone]    Dr. Adele Schilder  [phone]   [facility] Dept. 1) [address] [place] 2) [place] 3)  [place] [address], [place] [phone]  [phone]   [place] [phone] or [phone] (After Hours)

## 2013-08-17 NOTE — ED Notes (Signed)
Pt says she has been unable to see out of both eyes since yesterday.

## 2013-08-18 ENCOUNTER — Telehealth: Payer: Self-pay | Admitting: Neurology

## 2013-08-18 ENCOUNTER — Encounter: Payer: Self-pay | Admitting: Neurology

## 2013-08-18 ENCOUNTER — Ambulatory Visit (INDEPENDENT_AMBULATORY_CARE_PROVIDER_SITE_OTHER): Payer: BC Managed Care – PPO | Admitting: Neurology

## 2013-08-18 VITALS — BP 122/73 | HR 84 | Temp 97.3°F | Ht 62.0 in | Wt 207.0 lb

## 2013-08-18 DIAGNOSIS — H53139 Sudden visual loss, unspecified eye: Secondary | ICD-10-CM

## 2013-08-18 DIAGNOSIS — H4710 Unspecified papilledema: Secondary | ICD-10-CM

## 2013-08-18 DIAGNOSIS — H53133 Sudden visual loss, bilateral: Secondary | ICD-10-CM

## 2013-08-18 NOTE — Progress Notes (Signed)
Subjective:    Patient ID: THEONA MUHS is a 23 y.o. female.  HPI  Huston Foley, MD, PhD [facility] [address] [address] [place], [place] [address]  Dear Dr. Burgess Estelle,   I saw your patient, Melina Schools, upon your kind request in my neurologic clinic today for initial consultation of her blurry vision. Thank you for also talking to me on the phone to discuss her case earlier today. The patient is unaccompanied today. As you know, Ms. Corine Shelter is a very pleasant 23 year old right-handed woman with an underlying medical history of asthma, who presented to the emergency room on 08/16/2013 with new onset blurry vision. She describes a bilateral vision change, right more than left which evolved gradually and she had been treated for a sinus infection about 3 days prior with levofloxacin. She had also taken an over-the-counter cold medication, naproxen and a headache medication. Her headache resolved. She denied any diplopia, floaters, or scintillating scotomata at the time and denied any head injury.  At the time she had a maxillofacial CT with and without contrast and I reviewed the report: Ethmoid air cell and bilateral maxillary sinus disease. The study is otherwise negative. I personally reviewed the images through the PACS system. She was noted to have enlarged pupils but they were reactive. She was switched from levofloxacin to Augmentin for sinus infection. She presented back to the emergency room a day later, on 08/17/2013 with progressively worsening visual changes and reported waking up with dark and blurry vision bilaterally. She denied any other neurologic symptoms such as numbness, tingling, gait disturbance, dizziness, weakness. She did endorse an associated headache for which she had taken naproxen and a generic migraine medicine similar to Excedrin Migraine. She denied fevers, chills, neck pain, neck stiffness, rash.  She had a brain MRI without  contrast on 08/17/2013 as well as a MRV brain without contrast on 08/17/2013: Normal MRI of the brain. No cause of visual disturbance identified. No evidence of venous thrombosis on the parenchymal imaging. Mucosal inflammation of the paranasal sinuses similar to yesterday's study. I personally reviewed the images through the PACS system. She saw you today in your clinic and you noted a possible right afferent pupillary defect, dilated pupils, and possible papilledema bilaterally. She had corrected vision bilaterally of 20/400.  She currently reports a 5/10 headache in the frontal areas and around her nose and maxillary sinus. She has not started the augmentin yet. She has blurry and darkened vision. She denies any other neurological issues at this time. She denies any additional recent stressors her sleep deprivation. She was given IV fluids the first time she went to the emergency room. She denies any monocular vision loss in the past or any similar symptoms in the past. Of note, she snores, there is no report of witnessed apneas. She's not sure if her boyfriend has ever noted any apneas. She denies choking sensations or gasping for air while asleep.  Her Past Medical History Is Significant For: Past Medical History  Diagnosis Date  . Asthma   . Headache(784.0)     Her Past Surgical History Is Significant For: Past Surgical History  Procedure Laterality Date  . Tonsilectomy, adenoidectomy, bilateral myringotomy and tubes      Her Family History Is Significant For: Family History  Problem Relation Age of Onset  . High blood pressure Mother   . Heart disease Maternal Aunt   . Heart disease Maternal Uncle   . Diabetes Maternal Grandmother   .  Heart disease Maternal Grandmother   . Cancer Maternal Grandfather   . Cancer Paternal Grandfather     Her Social History Is Significant For: History   Social History  . Marital Status: Single    Spouse Name: N/A    Number of Children: 0  .  Years of Education: college   Occupational History  . [employer]    Social History Main Topics  . Smoking status: Never Smoker   . Smokeless tobacco: None  . Alcohol Use: No  . Drug Use: No  . Sexual Activity: None   Other Topics Concern  . None   Social History Narrative  . None    Her Allergies Are:  No Known Allergies:   Her Current Medications Are:  Outpatient Encounter Prescriptions as of 08/18/2013  Medication Sig  . amoxicillin-clavulanate (AUGMENTIN) 875-125 MG per tablet Take 1 tablet by mouth every 12 (twelve) hours.  Marland Kitchen desogestrel-ethinyl estradiol (VIORELE) 0.15-0.02/0.01 MG (21/5) tablet Take 1 tablet by mouth daily.  . [DISCONTINUED] diphenhydrAMINE (SOMINEX) 25 MG tablet Take 25 mg by mouth at bedtime as needed for sleep.  . [DISCONTINUED] levofloxacin (LEVAQUIN) 500 MG tablet Take 500 mg by mouth daily.  . [DISCONTINUED] naproxen (NAPROSYN) 500 MG tablet Take 500 mg by mouth 2 (two) times daily with a meal.  . [DISCONTINUED] Pseudoeph-Doxylamine-DM-APAP (NYQUIL PO) Take 30 mLs by mouth at bedtime.   Review of Systems:  Out of a complete 14 point review of systems, all are reviewed and negative with the exception of these symptoms as listed below:  Review of Systems  Eyes: Positive for pain and visual disturbance.  Respiratory: Positive for cough.        Sinus infection  Neurological: Positive for dizziness and headaches.    Objective:  Neurologic Exam  Physical Exam Physical Examination:   Filed Vitals:   08/18/13 1338  BP: 122/73  Pulse: 84  Temp: 97.3 F (36.3 C)    General Examination: The patient is a very pleasant 23 y.o. female in no acute distress. She appears well-developed and well-nourished and adequately groomed. She is obese.   HEENT: Normocephalic, atraumatic, pupils are equal, round and reactive to light and accommodation. Funduscopic exam is fairly normal with fairly sharp disc margins noted, however, her vision is poor.  She was not able to participate in vision testing. She does not even count fingers as well. Her visual fields are difficult to assess, as she cannot see the fingers. Extraocular tracking is good without limitation to gaze excursion or nystagmus noted. Normal smooth pursuit is noted. Hearing is grossly intact. Tympanic membranes are clear bilaterally. Face is symmetric with normal facial animation and normal facial sensation. Speech is clear with no dysarthria noted. There is no hypophonia. There is no lip, neck/head, jaw or voice tremor. Neck is supple with full range of passive and active motion. There are no carotid bruits on auscultation. Oropharynx exam reveals: mild mouth dryness, adequate dental hygiene and mild airway crowding, due to enlarged tongue. Mallampati is class II. Tongue protrudes centrally and palate elevates symmetrically. Tonsils are absent.    Chest: Clear to auscultation without wheezing, rhonchi or crackles noted.  Heart: S1+S2+0, regular and normal without murmurs, rubs or gallops noted.   Abdomen: Soft, non-tender and non-distended with normal bowel sounds appreciated on auscultation.  Extremities: There is no pitting edema in the distal lower extremities bilaterally. Pedal pulses are intact.  Skin: Warm and dry without trophic changes noted. There are no varicose veins.  Musculoskeletal: exam reveals no obvious joint deformities, tenderness or joint swelling or erythema.   Neurologically:  Mental status: The patient is awake, alert and oriented in all 4 spheres. Her memory, attention, language and knowledge are appropriate. There is no aphasia, agnosia, apraxia or anomia. Speech is clear with normal prosody and enunciation. Thought process is linear. Mood is congruent and affect is blunted.  Cranial nerves are as described above under HEENT exam. In addition, shoulder shrug is normal with equal shoulder height noted. Motor exam: Normal bulk, strength and tone is noted.  There is no drift, tremor or rebound. Romberg is negative. Reflexes are 2+ throughout. Toes are downgoing bilaterally. Fine motor skills are intact with normal finger taps, normal hand movements, normal rapid alternating patting, normal foot taps and normal foot agility.  Cerebellar testing shows no dysmetria or intention tremor on finger to nose testing. Heel to shin is unremarkable bilaterally. There is no truncal or gait ataxia.  Sensory exam is intact to light touch, pinprick, vibration, temperature sense and proprioception in the upper and lower extremities.  Gait, station and balance are unremarkable. No veering to one side is noted. No leaning to one side is noted. Posture is age-appropriate and stance is narrow based. No problems turning are noted. She turns en bloc. Tandem walk is unremarkable. Intact toe and heel stance is noted.               Assessment and Plan:   In summary, Antonieta LovelessJazmine S Corine ShelterWatkins is a very pleasant 23 y.o.-year old female with a history of asthma, who presents with sudden onset of bilateral severe blurry vision and vision loss at this juncture. She has progressed in that regard. She was seen in the emergency room twice recently. She also reports a headache and was diagnosed with a sinus infection. On neurological exam she has a nonfocal exam with the exception of poor vision and poor vision field. I cannot say for sure that she has papilledema but you mention papilledema in your assessment. Given the bilateral papilledema and poor vision, as well as her obesity, she is at risk for intracranial hypertension. To that end I talked to her and her mother as well as her father who later came in about potential differential diagnoses. Her MRI brain without contrast was negative but I don't think we should proceed with a brain MRI with contrast as well as orbital MRI with and without contrast and spinal fluid testing for opening pressure and to test for supportive findings for multiple  sclerosis which is another differential diagnosis. I reassured her that her neurological exam otherwise is good. She should see you back for vision testing and visual fields assessment as well as reassessment of her papilledema. I advised her and her family in that regard. I will see her back soon after the tests are done and we will also do some blood work today to look for inflammatory and autoimmune markers. We will call her with her test results. I talked her about potentially starting her on a medication to reduce fluid pressure on her brain. I will consider Diamox and Topamax in the near future if needed. I did not suggest any new medications at this time. She is advised to make a followup appointment with me as well as with you.  Thank you for talking with me on the phone earlier today as well. Thank you very much for allowing me to participate in the care of this nice patient. If I can  be of any further assistance to you please do not hesitate to call me at [phone].  Sincerely,   Star Age, MD, PhD

## 2013-08-18 NOTE — ED Provider Notes (Signed)
Medical screening examination/treatment/procedure(s) were conducted as a shared visit with non-physician practitioner(s) and myself.  I personally evaluated the patient during the encounter.  EKG Interpretation   None         Candyce ChurnJohn David Anae Hams, MD 08/18/13 [phone]

## 2013-08-18 NOTE — Telephone Encounter (Signed)
AT CHECKOUT PATIENT STATES SHE TALKED WITH BOYFRIEND AND SHE DOES STOP BREATHING WHEN SHE SNORES

## 2013-08-18 NOTE — Telephone Encounter (Signed)
Noted will let Dr. Frances FurbishAthar know, and will call the patient with any advise she may have to give

## 2013-08-18 NOTE — Patient Instructions (Signed)
Please ask family and friends or your significant other or bed partner if you snore and if so, how loud it is, and if you have breathing related issues in your sleep, such as: snorting sounds, choking sounds, pauses in your breathing or shallow breathing events. These may be symptoms of obstructive sleep apnea (OSA).   We will do additional MRI brain and orbits with contrast and spinal fluid testing, we will call you with the test result and may have you start taking a medicine to lower your fluid pressure around your brain.

## 2013-08-19 LAB — TSH: TSH: 0.962 u[IU]/mL (ref 0.450–4.500)

## 2013-08-19 LAB — RHEUMATOID FACTOR: RHEUMATOID FACTOR: 8.1 [IU]/mL (ref 0.0–13.9)

## 2013-08-19 LAB — C-REACTIVE PROTEIN: CRP: 4.6 mg/L (ref 0.0–4.9)

## 2013-08-19 LAB — ANA W/REFLEX: Anti Nuclear Antibody(ANA): NEGATIVE

## 2013-08-19 LAB — SEDIMENTATION RATE: SED RATE: 21 mm/h (ref 0–32)

## 2013-08-19 LAB — ANGIOTENSIN CONVERTING ENZYME: ANGIO CONVERT ENZYME: 52 U/L (ref 14–82)

## 2013-08-19 NOTE — ED Provider Notes (Signed)
Medical screening examination/treatment/procedure(s) were performed by non-physician practitioner and as supervising physician I was immediately available for consultation/collaboration.  EKG Interpretation   None        Toy BakerAnthony T Sandria Mcenroe, MD 08/19/13 1112

## 2013-08-19 NOTE — Progress Notes (Signed)
Quick Note:  Please advise pt that blood work was normal.  Shelly FoleySaima Rahmah Mccamy, MD, PhD [facility] ([facility]  ______

## 2013-08-28 ENCOUNTER — Telehealth: Payer: Self-pay | Admitting: Neurology

## 2013-08-28 NOTE — Telephone Encounter (Signed)
Shelly [name] with [facility] called and stated that patient informed them that she had a lumbar puncture last week [facility].  She wanted to let us know as she didn't see it notated in EPIC.  Thank you

## 2013-08-28 NOTE — Telephone Encounter (Signed)
Please advise 

## 2013-09-18 ENCOUNTER — Ambulatory Visit: Payer: BC Managed Care – PPO | Admitting: Neurology

## 2014-01-19 ENCOUNTER — Other Ambulatory Visit: Payer: Self-pay | Admitting: Obstetrics and Gynecology

## 2014-01-19 ENCOUNTER — Other Ambulatory Visit (HOSPITAL_COMMUNITY)
Admission: RE | Admit: 2014-01-19 | Discharge: 2014-01-19 | Disposition: A | Discharge status: Home / Self Care | Payer: BC Managed Care – PPO | Source: Ambulatory Visit | Attending: Obstetrics and Gynecology | Admitting: Obstetrics and Gynecology

## 2014-01-19 DIAGNOSIS — Z113 Encounter for screening for infections with a predominantly sexual mode of transmission: Secondary | ICD-10-CM | POA: Insufficient documentation

## 2014-01-22 LAB — CYTOLOGY - PAP

## 2014-02-26 ENCOUNTER — Other Ambulatory Visit (HOSPITAL_COMMUNITY)
Admission: RE | Admit: 2014-02-26 | Discharge: 2014-02-26 | Disposition: A | Discharge status: Home / Self Care | Payer: BC Managed Care – PPO | Source: Ambulatory Visit | Attending: Obstetrics and Gynecology | Admitting: Obstetrics and Gynecology

## 2014-02-26 ENCOUNTER — Other Ambulatory Visit: Payer: Self-pay | Admitting: Obstetrics and Gynecology

## 2014-02-26 DIAGNOSIS — Z01419 Encounter for gynecological examination (general) (routine) without abnormal findings: Secondary | ICD-10-CM | POA: Insufficient documentation

## 2014-02-27 LAB — CYTOLOGY - PAP

## 2014-07-23 ENCOUNTER — Emergency Department (HOSPITAL_COMMUNITY)
Admission: EM | Admit: 2014-07-23 | Discharge: 2014-07-23 | Disposition: A | Discharge status: Home / Self Care | Payer: Worker's Compensation | Attending: Emergency Medicine | Admitting: Emergency Medicine

## 2014-07-23 ENCOUNTER — Encounter (HOSPITAL_COMMUNITY): Payer: Self-pay

## 2014-07-23 DIAGNOSIS — Y998 Other external cause status: Secondary | ICD-10-CM | POA: Insufficient documentation

## 2014-07-23 DIAGNOSIS — S00432A Contusion of left ear, initial encounter: Secondary | ICD-10-CM | POA: Insufficient documentation

## 2014-07-23 DIAGNOSIS — Y9389 Activity, other specified: Secondary | ICD-10-CM | POA: Diagnosis not present

## 2014-07-23 DIAGNOSIS — W208XXA Other cause of strike by thrown, projected or falling object, initial encounter: Secondary | ICD-10-CM | POA: Diagnosis not present

## 2014-07-23 DIAGNOSIS — S0083XA Contusion of other part of head, initial encounter: Secondary | ICD-10-CM | POA: Insufficient documentation

## 2014-07-23 DIAGNOSIS — Y9289 Other specified places as the place of occurrence of the external cause: Secondary | ICD-10-CM | POA: Insufficient documentation

## 2014-07-23 DIAGNOSIS — S0990XA Unspecified injury of head, initial encounter: Secondary | ICD-10-CM | POA: Diagnosis present

## 2014-07-23 DIAGNOSIS — S0093XA Contusion of unspecified part of head, initial encounter: Secondary | ICD-10-CM

## 2014-07-23 DIAGNOSIS — J45909 Unspecified asthma, uncomplicated: Secondary | ICD-10-CM | POA: Diagnosis not present

## 2014-07-23 DIAGNOSIS — Z79899 Other long term (current) drug therapy: Secondary | ICD-10-CM | POA: Diagnosis not present

## 2014-07-23 NOTE — ED Provider Notes (Signed)
CSN: [id number]     Arrival date & time 07/23/14  1846 History   First MD Initiated Contact with Patient 07/23/14 2039    This chart was scribed for non-physician practitioner, Mellody DrownLauren Dionisios Ricci, PA working with Ethelda ChickMartha K Linker, MD by Marica OtterNusrat Rahman, ED Scribe. This patient was seen in room WTR7/WTR7 and the patient's care was started at 9:01 PM.  Chief Complaint  Patient presents with  . Head Injury  . Otalgia   HPI Comments: Shelly [name] is a 23 y.o. female who presents to the Emergency Department complaining of headache.  She reports a sudden onset, traumatic, headache and aching left ear pain onset today, at work. Reports bucket fell approximately 4515ft from a shelf and struck her on the left posterior head. Pt rates her current pain a 5 out of 10. Pt denies neck pain, vision changes.     Patient is a 23 y.o. female presenting with ear pain. The history is provided by the patient. No language interpreter was used.  Otalgia Associated symptoms: no fever, no headaches and no neck pain     Past Medical History  Diagnosis Date  . Asthma   . WUJWJXBJ(478.2Headache(784.0)    Past Surgical History  Procedure Laterality Date  . Tonsilectomy, adenoidectomy, bilateral myringotomy and tubes     Family History  Problem Relation Age of Onset  . High blood pressure Mother   . Heart disease Maternal Aunt   . Heart disease Maternal Uncle   . Diabetes Maternal Grandmother   . Heart disease Maternal Grandmother   . Cancer Maternal Grandfather   . Cancer Paternal Grandfather    History  Substance Use Topics  . Smoking status: Never Smoker   . Smokeless tobacco: Not on file  . Alcohol Use: Yes   OB History    No data available     Review of Systems  Constitutional: Negative for fever and chills.  HENT: Positive for ear pain.   Eyes: Negative for photophobia and visual disturbance.  Musculoskeletal: Negative for gait problem and neck pain.  Skin: Negative for wound.  Neurological: Negative for  weakness, light-headedness and headaches.  Psychiatric/Behavioral: Negative for confusion.   Allergies  Review of patient's allergies indicates no known allergies.  Home Medications   Prior to Admission medications   Medication Sig Start Date End Date Taking? Authorizing Provider  amoxicillin-clavulanate (AUGMENTIN) 875-125 MG per tablet Take 1 tablet by mouth every 12 (twelve) hours. 08/16/13   Renne CriglerJoshua Geiple, PA-C  desogestrel-ethinyl estradiol (VIORELE) 0.15-0.02/0.01 MG (21/5) tablet Take 1 tablet by mouth daily.    Historical Provider, MD   Triage Vitals: BP 138/77 mmHg  Pulse 64  Temp(Src) 98.2 F (36.8 C) (Oral)  Resp 16  SpO2 100%  LMP 07/23/2014 Physical Exam  Constitutional: She is oriented to person, place, and time. She appears well-developed and well-nourished.  Non-toxic appearance. She does not have a sickly appearance. No distress.  HENT:  Head: Normocephalic. Head is without Battle's sign.    Right Ear: Tympanic membrane normal. Tympanic membrane is not perforated. No hemotympanum.  Left Ear: Tympanic membrane normal. Tympanic membrane is not perforated. No hemotympanum.  Small hematoma, tender to palpation, no crepitus.  Eyes: Conjunctivae and EOM are normal. Pupils are equal, round, and reactive to light.  Neck: Neck supple. No spinous process tenderness and no muscular tenderness present.  Pulmonary/Chest: Effort normal. No respiratory distress.  Musculoskeletal: Normal range of motion.  Neurological: She is alert and oriented to person, place, and time.  She has normal strength. No sensory deficit. She displays a negative Romberg sign. Coordination and gait normal.  Speech is clear and goal oriented, follows commands II-Visual fields were normal.   III/IV/VI-Pupils were equal and reacted. Extraocular movements were full and conjugate.  V/VII-no facial droop.   VIII-normal.   Motor: Strength 5/5 to upper and lower extremities bilaterally. Moves all 4  extremities equally. Sensory: normal sensation to upper and lower extremities.  Cerebellar: Normal finger to nose bilaterally No pronator drift. Normal gait  Skin: Skin is warm and dry.  Psychiatric: She has a normal mood and affect. Her behavior is normal.  Nursing note and vitals reviewed.   ED Course  Procedures (including critical care time) 9:05 PM-Discussed treatment plan with pt at bedside and pt agreed to plan.   Labs Review Labs Reviewed - No data to display  Imaging Review No results found.   EKG Interpretation None      MDM   Final diagnoses:  Head contusion, initial encounter   The patient is a 23 year old female presenting with injury to occiput of head today. No neurologic deficits on exam. Small hematoma, no cervical midline tenderness. Plan to treat with ice, ibuprofen, follow-up with PCP. I don't feel as though imaging is indicated at this time.  I personally performed the services described in this documentation, which was scribed in my presence. The recorded information has been reviewed and is accurate.    Mellody DrownLauren Kepler Mccabe, PA-C 07/23/14 2139  Ethelda ChickMartha K Linker, MD 07/23/14 [phone]

## 2014-07-23 NOTE — Discharge Instructions (Signed)
Call for a follow up appointment with a Family or Primary Care Provider.  Return if Symptoms worsen.   Take medication as prescribed.  Ibuprofen 800 mg every 8 hours for pain. Ice your head 3-4 times a day.

## 2014-07-23 NOTE — ED Notes (Signed)
Pt reports getting hitting on L posterior head by a "large plastic bucket" at work.  C/o L ear pain.  Pain score 5/10.  Denies vision problems.

## 2015-01-25 ENCOUNTER — Other Ambulatory Visit (HOSPITAL_COMMUNITY)
Admission: RE | Admit: 2015-01-25 | Discharge: 2015-01-25 | Disposition: A | Discharge status: Home / Self Care | Payer: BLUE CROSS/BLUE SHIELD | Source: Ambulatory Visit | Attending: Obstetrics and Gynecology | Admitting: Obstetrics and Gynecology

## 2015-01-25 ENCOUNTER — Other Ambulatory Visit: Payer: Self-pay | Admitting: Obstetrics and Gynecology

## 2015-01-25 DIAGNOSIS — Z113 Encounter for screening for infections with a predominantly sexual mode of transmission: Secondary | ICD-10-CM | POA: Diagnosis present

## 2015-01-25 DIAGNOSIS — Z01419 Encounter for gynecological examination (general) (routine) without abnormal findings: Secondary | ICD-10-CM | POA: Diagnosis not present

## 2015-01-26 LAB — CYTOLOGY - PAP

## 2015-06-03 NOTE — Telephone Encounter (Signed)
Error

## 2015-08-08 NOTE — L&D Delivery Note (Signed)
Delivery Note At 10:05 AM a viable female was delivered via Vaginal, Spontaneous Delivery (Presentation: ROA asynclitic  ).  APGAR: 9, 9; weight 6 lb 11.2 oz (3040 g).   Placenta status: Cord donor Cord:  with the following complications: .    Anesthesia:  epidural Episiotomy: None Lacerations: 2nd degree;Perineal, right vaginal Bjorklund and labia Suture Repair: 2.0 3.0 vicryl Est. Blood Loss (mL): 400  Mom to postpartum.  Baby to Couplet care / Skin to Skin.  Myna HidalgoZAN, Danford Tat, M 03/23/2016, 12:44 PM

## 2016-03-02 LAB — OB RESULTS CONSOLE GBS: STREP GROUP B AG: NEGATIVE

## 2016-03-22 ENCOUNTER — Observation Stay (HOSPITAL_COMMUNITY): Payer: BLUE CROSS/BLUE SHIELD

## 2016-03-22 ENCOUNTER — Encounter (HOSPITAL_COMMUNITY): Payer: Self-pay

## 2016-03-22 ENCOUNTER — Inpatient Hospital Stay (HOSPITAL_COMMUNITY)
Admission: EM | Admit: 2016-03-22 | Discharge: 2016-03-25 | DRG: 775 | Disposition: A | Discharge status: Home / Self Care | Payer: BLUE CROSS/BLUE SHIELD | Attending: Obstetrics and Gynecology | Admitting: Obstetrics and Gynecology

## 2016-03-22 ENCOUNTER — Emergency Department (HOSPITAL_COMMUNITY): Payer: BLUE CROSS/BLUE SHIELD

## 2016-03-22 DIAGNOSIS — O99214 Obesity complicating childbirth: Secondary | ICD-10-CM | POA: Diagnosis present

## 2016-03-22 DIAGNOSIS — S20211A Contusion of right front wall of thorax, initial encounter: Secondary | ICD-10-CM | POA: Diagnosis present

## 2016-03-22 DIAGNOSIS — Z3490 Encounter for supervision of normal pregnancy, unspecified, unspecified trimester: Secondary | ICD-10-CM

## 2016-03-22 DIAGNOSIS — Z3400 Encounter for supervision of normal first pregnancy, unspecified trimester: Secondary | ICD-10-CM

## 2016-03-22 DIAGNOSIS — O9952 Diseases of the respiratory system complicating childbirth: Secondary | ICD-10-CM | POA: Diagnosis present

## 2016-03-22 DIAGNOSIS — Z8249 Family history of ischemic heart disease and other diseases of the circulatory system: Secondary | ICD-10-CM

## 2016-03-22 DIAGNOSIS — Z3A39 39 weeks gestation of pregnancy: Secondary | ICD-10-CM | POA: Diagnosis not present

## 2016-03-22 DIAGNOSIS — J45909 Unspecified asthma, uncomplicated: Secondary | ICD-10-CM | POA: Diagnosis present

## 2016-03-22 DIAGNOSIS — Z833 Family history of diabetes mellitus: Secondary | ICD-10-CM

## 2016-03-22 DIAGNOSIS — S20319A Abrasion of unspecified front wall of thorax, initial encounter: Secondary | ICD-10-CM | POA: Diagnosis present

## 2016-03-22 DIAGNOSIS — Z6841 Body Mass Index (BMI) 40.0 and over, adult: Secondary | ICD-10-CM

## 2016-03-22 DIAGNOSIS — O26893 Other specified pregnancy related conditions, third trimester: Secondary | ICD-10-CM | POA: Diagnosis present

## 2016-03-22 LAB — CBC
HEMATOCRIT: 36.9 % (ref 36.0–46.0)
Hemoglobin: 13 g/dL (ref 12.0–15.0)
MCH: 27.5 pg (ref 26.0–34.0)
MCHC: 35.2 g/dL (ref 30.0–36.0)
MCV: 78.2 fL (ref 78.0–100.0)
Platelets: ADEQUATE 10*3/uL (ref 150–400)
RBC: 4.72 MIL/uL (ref 3.87–5.11)
RDW: 14.4 % (ref 11.5–15.5)
WBC: 11.3 10*3/uL — ABNORMAL HIGH (ref 4.0–10.5)

## 2016-03-22 LAB — KLEIHAUER-BETKE STAIN
# VIALS RHIG: 1
Fetal Cells %: 0 %
Quantitation Fetal Hemoglobin: 0 mL

## 2016-03-22 LAB — TYPE AND SCREEN
ABO/RH(D): B POS
ANTIBODY SCREEN: NEGATIVE

## 2016-03-22 LAB — PROTIME-INR
INR: 1.01
PROTHROMBIN TIME: 13.3 s (ref 11.4–15.2)

## 2016-03-22 LAB — APTT: aPTT: 28 seconds (ref 24–36)

## 2016-03-22 LAB — ABO/RH: ABO/RH(D): B POS

## 2016-03-22 LAB — FIBRINOGEN: Fibrinogen: 456 mg/dL (ref 210–475)

## 2016-03-22 MED ORDER — OXYCODONE-ACETAMINOPHEN 5-325 MG PO TABS: 1.0000 | ORAL_TABLET | ORAL | Status: DC | PRN

## 2016-03-22 MED ORDER — LACTATED RINGERS IV SOLN
500.0000 mL | INTRAVENOUS | Status: DC | PRN
  Administered 2016-03-23: 500 mL via INTRAVENOUS

## 2016-03-22 MED ORDER — OXYTOCIN 40 UNITS IN LACTATED RINGERS INFUSION - SIMPLE MED: 2.5000 [IU]/h | INTRAVENOUS | Status: DC

## 2016-03-22 MED ORDER — LACTATED RINGERS IV BOLUS (SEPSIS): 1000.0000 mL | Freq: Once | INTRAVENOUS | Status: DC

## 2016-03-22 MED ORDER — PRENATAL MULTIVITAMIN CH: 1.0000 | ORAL_TABLET | Freq: Every day | ORAL | Status: DC

## 2016-03-22 MED ORDER — OXYTOCIN 40 UNITS IN LACTATED RINGERS INFUSION - SIMPLE MED
1.0000 m[IU]/min | INTRAVENOUS | Status: DC
  Administered 2016-03-23: 2 m[IU]/min via INTRAVENOUS
  Filled 2016-03-22: qty 1000

## 2016-03-22 MED ORDER — ONDANSETRON HCL 4 MG/2ML IJ SOLN: 4.0000 mg | Freq: Four times a day (QID) | INTRAMUSCULAR | Status: DC | PRN

## 2016-03-22 MED ORDER — HYDROXYZINE HCL 50 MG PO TABS
50.0000 mg | ORAL_TABLET | Freq: Four times a day (QID) | ORAL | Status: DC | PRN
  Filled 2016-03-22: qty 1

## 2016-03-22 MED ORDER — OXYTOCIN BOLUS FROM INFUSION
500.0000 mL | Freq: Once | INTRAVENOUS | Status: AC
  Administered 2016-03-23: 500 mL via INTRAVENOUS

## 2016-03-22 MED ORDER — CALCIUM CARBONATE ANTACID 500 MG PO CHEW: 2.0000 | CHEWABLE_TABLET | ORAL | Status: DC | PRN

## 2016-03-22 MED ORDER — DOCUSATE SODIUM 100 MG PO CAPS: 100.0000 mg | ORAL_CAPSULE | Freq: Every day | ORAL | Status: DC

## 2016-03-22 MED ORDER — LIDOCAINE HCL (PF) 1 % IJ SOLN
30.0000 mL | INTRAMUSCULAR | Status: DC | PRN
  Filled 2016-03-22 (×2): qty 30

## 2016-03-22 MED ORDER — ACETAMINOPHEN 325 MG PO TABS
650.0000 mg | ORAL_TABLET | ORAL | Status: DC | PRN
Start: 2016-03-22 — End: 2016-03-23

## 2016-03-22 MED ORDER — ACETAMINOPHEN 325 MG PO TABS
650.0000 mg | ORAL_TABLET | ORAL | Status: DC | PRN
  Administered 2016-03-22: 650 mg via ORAL
  Filled 2016-03-22: qty 2

## 2016-03-22 MED ORDER — LACTATED RINGERS IV SOLN
INTRAVENOUS | Status: DC
Start: 2016-03-23 — End: 2016-03-23
  Administered 2016-03-23: 02:00:00 via INTRAVENOUS

## 2016-03-22 MED ORDER — TERBUTALINE SULFATE 1 MG/ML IJ SOLN
0.2500 mg | Freq: Once | INTRAMUSCULAR | Status: DC | PRN
  Filled 2016-03-22: qty 1

## 2016-03-22 MED ORDER — LACTATED RINGERS IV SOLN
INTRAVENOUS | Status: DC
  Administered 2016-03-22: 20:00:00 via INTRAVENOUS

## 2016-03-22 MED ORDER — FENTANYL CITRATE (PF) 100 MCG/2ML IJ SOLN
50.0000 ug | INTRAMUSCULAR | Status: DC | PRN
  Administered 2016-03-23 (×2): 100 ug via INTRAVENOUS
  Filled 2016-03-22 (×2): qty 2

## 2016-03-22 MED ORDER — LACTATED RINGERS IV BOLUS (SEPSIS)
1000.0000 mL | Freq: Once | INTRAVENOUS | Status: AC
  Administered 2016-03-22: 1000 mL via INTRAVENOUS

## 2016-03-22 MED ORDER — SOD CITRATE-CITRIC ACID 500-334 MG/5ML PO SOLN: 30.0000 mL | ORAL | Status: DC | PRN

## 2016-03-22 MED ORDER — OXYCODONE-ACETAMINOPHEN 5-325 MG PO TABS
2.0000 | ORAL_TABLET | ORAL | Status: DC | PRN
Start: 2016-03-22 — End: 2016-03-23

## 2016-03-22 MED ORDER — ZOLPIDEM TARTRATE 5 MG PO TABS: 5.0000 mg | ORAL_TABLET | Freq: Every evening | ORAL | Status: DC | PRN
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
## 2016-03-22 NOTE — Progress Notes (Signed)
[facility], pt will be care-linked over for further monitoring.

## 2016-03-22 NOTE — Progress Notes (Signed)
Patient with BPP 6 out of 8.  FHR tracing baseline 130 moderate variability. Accelerations present with occasional variable decelerations.  Cervix 2.5/75/-1  Given non-reassuring testing and favorable cervix at term recommend induction. Plan of care discussed with patient
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
## 2016-03-22 NOTE — ED Notes (Signed)
Rapid response OB at bedside 
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
## 2016-03-22 NOTE — Progress Notes (Signed)
Prenatal records on chart
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
## 2016-03-23 ENCOUNTER — Encounter (HOSPITAL_COMMUNITY): Payer: Self-pay

## 2016-03-23 ENCOUNTER — Inpatient Hospital Stay (HOSPITAL_COMMUNITY): Payer: BLUE CROSS/BLUE SHIELD | Admitting: Anesthesiology

## 2016-03-23 LAB — CBC
HEMATOCRIT: 33.9 % — AB (ref 36.0–46.0)
Hemoglobin: 11.7 g/dL — ABNORMAL LOW (ref 12.0–15.0)
MCH: 27 pg (ref 26.0–34.0)
MCHC: 34.5 g/dL (ref 30.0–36.0)
MCV: 78.1 fL (ref 78.0–100.0)
PLATELETS: 170 10*3/uL (ref 150–400)
RBC: 4.34 MIL/uL (ref 3.87–5.11)
RDW: 14.3 % (ref 11.5–15.5)
WBC: 18.9 10*3/uL — ABNORMAL HIGH (ref 4.0–10.5)

## 2016-03-23 LAB — PLATELET COUNT: Platelets: 191 10*3/uL (ref 150–400)

## 2016-03-23 LAB — RPR: RPR: NONREACTIVE

## 2016-03-23 MED ORDER — ONDANSETRON HCL 4 MG/2ML IJ SOLN: 4.0000 mg | INTRAMUSCULAR | Status: DC | PRN

## 2016-03-23 MED ORDER — FENTANYL 2.5 MCG/ML BUPIVACAINE 1/10 % EPIDURAL INFUSION (WH - ANES)
14.0000 mL/h | INTRAMUSCULAR | Status: DC | PRN
Start: 2016-03-23 — End: 2016-03-23
  Administered 2016-03-23: 12 mL/h via EPIDURAL
  Filled 2016-03-23: qty 125

## 2016-03-23 MED ORDER — LIDOCAINE HCL (PF) 1 % IJ SOLN
INTRAMUSCULAR | Status: DC | PRN
  Administered 2016-03-23: 4 mL via EPIDURAL
  Administered 2016-03-23: 3 mL via EPIDURAL

## 2016-03-23 MED ORDER — ACETAMINOPHEN 325 MG PO TABS: 650.0000 mg | ORAL_TABLET | ORAL | Status: DC | PRN

## 2016-03-23 MED ORDER — TETANUS-DIPHTH-ACELL PERTUSSIS 5-2.5-18.5 LF-MCG/0.5 IM SUSP: 0.5000 mL | Freq: Once | INTRAMUSCULAR | Status: DC

## 2016-03-23 MED ORDER — BENZOCAINE-MENTHOL 20-0.5 % EX AERO
1.0000 "application " | INHALATION_SPRAY | CUTANEOUS | Status: DC | PRN
  Administered 2016-03-23: 1 via TOPICAL
  Filled 2016-03-23: qty 56

## 2016-03-23 MED ORDER — FENTANYL CITRATE (PF) 100 MCG/2ML IJ SOLN
INTRAMUSCULAR | Status: AC
  Administered 2016-03-23: 100 ug via INTRAVENOUS
  Filled 2016-03-23: qty 2

## 2016-03-23 MED ORDER — DIBUCAINE 1 % RE OINT: 1.0000 "application " | TOPICAL_OINTMENT | RECTAL | Status: DC | PRN

## 2016-03-23 MED ORDER — LACTATED RINGERS IV SOLN: 500.0000 mL | Freq: Once | INTRAVENOUS | Status: DC

## 2016-03-23 MED ORDER — EPHEDRINE 5 MG/ML INJ
10.0000 mg | INTRAVENOUS | Status: DC | PRN
  Filled 2016-03-23: qty 4

## 2016-03-23 MED ORDER — SIMETHICONE 80 MG PO CHEW: 80.0000 mg | CHEWABLE_TABLET | ORAL | Status: DC | PRN

## 2016-03-23 MED ORDER — WITCH HAZEL-GLYCERIN EX PADS: 1.0000 "application " | MEDICATED_PAD | CUTANEOUS | Status: DC | PRN

## 2016-03-23 MED ORDER — COCONUT OIL OIL: 1.0000 "application " | TOPICAL_OIL | Status: DC | PRN

## 2016-03-23 MED ORDER — PHENYLEPHRINE 40 MCG/ML (10ML) SYRINGE FOR IV PUSH (FOR BLOOD PRESSURE SUPPORT)
80.0000 ug | PREFILLED_SYRINGE | INTRAVENOUS | Status: DC | PRN
  Administered 2016-03-23: 80 ug via INTRAVENOUS
  Filled 2016-03-23: qty 10
  Filled 2016-03-23: qty 5

## 2016-03-23 MED ORDER — SENNOSIDES-DOCUSATE SODIUM 8.6-50 MG PO TABS
2.0000 | ORAL_TABLET | ORAL | Status: DC
  Administered 2016-03-23 – 2016-03-25 (×2): 2 via ORAL
  Filled 2016-03-23 (×2): qty 2

## 2016-03-23 MED ORDER — IBUPROFEN 600 MG PO TABS
600.0000 mg | ORAL_TABLET | Freq: Four times a day (QID) | ORAL | Status: DC
  Administered 2016-03-23 – 2016-03-25 (×9): 600 mg via ORAL
  Filled 2016-03-23 (×9): qty 1

## 2016-03-23 MED ORDER — PHENYLEPHRINE 40 MCG/ML (10ML) SYRINGE FOR IV PUSH (FOR BLOOD PRESSURE SUPPORT)
80.0000 ug | PREFILLED_SYRINGE | INTRAVENOUS | Status: DC | PRN
  Filled 2016-03-23: qty 5

## 2016-03-23 MED ORDER — ONDANSETRON HCL 4 MG PO TABS
4.0000 mg | ORAL_TABLET | ORAL | Status: DC | PRN
Start: 2016-03-23 — End: 2016-03-25

## 2016-03-23 MED ORDER — ZOLPIDEM TARTRATE 5 MG PO TABS
5.0000 mg | ORAL_TABLET | Freq: Every evening | ORAL | Status: DC | PRN
Start: 2016-03-23 — End: 2016-03-25

## 2016-03-23 MED ORDER — PRENATAL MULTIVITAMIN CH
1.0000 | ORAL_TABLET | Freq: Every day | ORAL | Status: DC
  Administered 2016-03-24: 1 via ORAL
  Filled 2016-03-23 (×2): qty 1

## 2016-03-23 MED ORDER — DIPHENHYDRAMINE HCL 25 MG PO CAPS: 25.0000 mg | ORAL_CAPSULE | Freq: Four times a day (QID) | ORAL | Status: DC | PRN

## 2016-03-23 MED ORDER — DIPHENHYDRAMINE HCL 50 MG/ML IJ SOLN: 12.5000 mg | INTRAMUSCULAR | Status: DC | PRN

## 2016-03-23 NOTE — Anesthesia Preprocedure Evaluation (Signed)
Anesthesia Evaluation  Patient identified by MRN, date of birth, ID band Patient awake    Reviewed: Allergy & Precautions, H&P , Patient's Chart, lab work & pertinent test results  Airway Mallampati: III  TM Distance: >3 FB Neck ROM: full    Dental no notable dental hx. (+) Teeth Intact   Pulmonary neg pulmonary ROS, shortness of breath and with exertion, asthma ,    Pulmonary exam normal breath sounds clear to auscultation       Cardiovascular negative cardio ROS Normal cardiovascular exam Rhythm:regular Rate:Normal     Neuro/Psych  Headaches, negative psych ROS   GI/Hepatic negative GI ROS, Neg liver ROS,   Endo/Other  Morbid obesity  Renal/GU negative Renal ROS  negative genitourinary   Musculoskeletal   Abdominal   Peds  Hematology negative hematology ROS (+)   Anesthesia Other Findings   Reproductive/Obstetrics (+) Pregnancy                             Anesthesia Physical Anesthesia Plan  ASA: II  Anesthesia Plan: Epidural   Post-op Pain Management:    Induction:   Airway Management Planned:   Additional Equipment:   Intra-op Plan:   Post-operative Plan:   Informed Consent: I have reviewed the patients History and Physical, chart, labs and discussed the procedure including the risks, benefits and alternatives for the proposed anesthesia with the patient or authorized representative who has indicated his/her understanding and acceptance.     Plan Discussed with: Anesthesiologist  Anesthesia Plan Comments:         Anesthesia Quick Evaluation

## 2016-03-23 NOTE — Anesthesia Procedure Notes (Signed)
Epidural Patient location during procedure: OB Start time: 03/23/2016 3:41 AM  Staffing Anesthesiologist: Mal AmabileFOSTER, Josey Forcier Performed: anesthesiologist   Preanesthetic Checklist Completed: patient identified, site marked, surgical consent, pre-op evaluation, timeout performed, IV checked, risks and benefits discussed and monitors and equipment checked  Epidural Patient position: sitting Prep: site prepped and draped and DuraPrep Patient monitoring: continuous pulse ox and blood pressure Approach: midline Location: L3-L4 Injection technique: LOR air  Needle:  Needle type: Tuohy  Needle gauge: 17 G Needle length: 9 cm and 9 Needle insertion depth: 8 cm Catheter type: closed end flexible Catheter size: 19 Gauge Catheter at skin depth: 13 cm Test dose: negative and Other  Assessment Events: blood not aspirated, injection not painful, no injection resistance, negative IV test and no paresthesia  Additional Notes Patient identified. Risks and benefits discussed including failed block, incomplete  Pain control, post dural puncture headache, nerve damage, paralysis, blood pressure Changes, nausea, vomiting, reactions to medications-both toxic and allergic and post Partum back pain. All questions were answered. Patient expressed understanding and wished to proceed. Sterile technique was used throughout procedure. Epidural site was Dressed with sterile barrier dressing. No paresthesias, signs of intravascular injection Or signs of intrathecal spread were encountered.  Patient was more comfortable after the epidural was dosed. Please see RN's note for documentation of vital signs and FHR which are stable.

## 2016-03-23 NOTE — Lactation Note (Signed)
This note was copied from a baby's chart. Lactation Consultation Note  Mother reports that 6 HOL baby has BF since delivery. She is holding the baby on her chest and the baby is asleep.  Reviewed feeding cues, output and hand expression.  Information given on support groups and outpatient services.  Follow-up tomorrow or sooner if needed.  Patient Name: Shelly [name] AVWUJ'WToday's Date: 03/23/2016 Reason for consult: Initial assessment   Maternal Data Has patient been taught Hand Expression?: Yes  Feeding Feeding Type: Breast Fed Length of feed: 10 min  LATCH Score/Interventions Latch: Too sleepy or reluctant, no latch achieved, no sucking elicited.  Audible Swallowing: None  Type of Nipple: Everted at rest and after stimulation  Comfort (Breast/Nipple): Soft / non-tender     Hold (Positioning): No assistance needed to correctly position infant at breast.  LATCH Score: 6  Lactation Tools Discussed/Used     Consult Status Consult Status: Follow-up Date: 03/24/16 Follow-up type: In-patient    Shelly [name], Shelly [name] 03/23/2016, 4:23 PM

## 2016-03-23 NOTE — Anesthesia Pain Management Evaluation Note (Signed)
  CRNA Pain Management Visit Note  Patient: Shelly [name], 25 y.o., female  "Hello I am a member of the anesthesia team at [facility]. We have an anesthesia team available at all times to provide care throughout the hospital, including epidural management and anesthesia for C-section. I don't know your plan for the delivery whether it a natural birth, water birth, IV sedation, nitrous supplementation, doula or epidural, but we want to meet your pain goals."   1.Was your pain managed to your expectations on prior hospitalizations?   No prior hospitalizations  2.What is your expectation for pain management during this hospitalization?     Epidural  3.How can we help you reach that goal? epidural  Record the patient's initial score and the patient's pain goal.   Pain: 0  Pain Goal: 4 [facility] wants you to be able to say your pain was always managed very well.  Shelly [name] 03/23/2016
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
## 2016-03-23 NOTE — Anesthesia Postprocedure Evaluation (Signed)
Anesthesia Post Note  Patient: Shelly [name]  Procedure(s) Performed: * No procedures listed *  Patient location during evaluation: Mother Baby Anesthesia Type: Epidural Level of consciousness: awake and alert and oriented Pain management: satisfactory to patient Vital Signs Assessment: post-procedure vital signs reviewed and stable Respiratory status: spontaneous breathing and nonlabored ventilation Cardiovascular status: stable Postop Assessment: no headache, no backache, no signs of nausea or vomiting, adequate PO intake and patient able to bend at knees (patient up walking) Anesthetic complications: no     Last Vitals:  Vitals:   03/23/16 1220 03/23/16 1320  BP: 131/65 120/64  Pulse: 82 74  Resp: 18 18  Temp: 36.7 C 36.7 C    Last Pain:  Vitals:   03/23/16 1320  TempSrc: Oral  PainSc:    Pain Goal: Patients Stated Pain Goal: 3 (03/22/16 1942)               Madison HickmanGREGORY,Latreshia Beauchaine

## 2016-03-24 LAB — CBC
HEMATOCRIT: 30.2 % — AB (ref 36.0–46.0)
Hemoglobin: 10.3 g/dL — ABNORMAL LOW (ref 12.0–15.0)
MCH: 26.9 pg (ref 26.0–34.0)
MCHC: 34.1 g/dL (ref 30.0–36.0)
MCV: 78.9 fL (ref 78.0–100.0)
PLATELETS: 132 10*3/uL — AB (ref 150–400)
RBC: 3.83 MIL/uL — ABNORMAL LOW (ref 3.87–5.11)
RDW: 14.7 % (ref 11.5–15.5)
WBC: 13.4 10*3/uL — AB (ref 4.0–10.5)

## 2016-03-24 NOTE — Progress Notes (Signed)
Post Partum Day 1 SVD Subjective: no complaints, up ad lib, voiding and tolerating PO  Objective: Blood pressure 128/71, pulse 91, temperature 97.8 F (36.6 C), temperature source Axillary, resp. rate 18, height 5\' 2"  (1.575 m), weight 229 lb (103.9 kg), SpO2 100 %, unknown if currently breastfeeding.  Physical Exam:  General: alert and cooperative Lochia: appropriate Uterine Fundus: firm Incision: NA DVT Evaluation: No evidence of DVT seen on physical exam.   Recent Labs  03/23/16 0555 03/24/16 0524  HGB 11.7* 10.3*  HCT 33.9* 30.2*    Assessment/Plan: Plan for discharge tomorrow, Breastfeeding and Lactation consult Routine Postpartum Care   LOS: 2 days   Shelly [name] J. 03/24/2016, 8:17 AM

## 2016-03-25 MED ORDER — IBUPROFEN 600 MG PO TABS: 600.0000 mg | ORAL_TABLET | Freq: Four times a day (QID) | ORAL | 1 refills | Status: DC | PRN

## 2016-03-25 NOTE — Lactation Note (Signed)
This note was copied from a baby's chart. Lactation Consultation Note:when I arrive to room mother was attempting to latch infant with swaddle blanket. Mother taught off sided latch. Infant latched very easily with good wide open mouth. Observed frequent swallows. Mother advised to breastfeed 8-12 times in 24 hours. Mother is aware of available lactation services and community support.   Patient Name: Shelly Marty HeckJazmine Oswald ZOXWR'UToday's Date: [name] Reason for consult: Follow-up assessment   Maternal Data    Feeding Feeding Type: Breast Fed Length of feed: 15 min  LATCH Score/Interventions Latch: Grasps breast easily, tongue down, lips flanged, rhythmical sucking. Intervention(s): Teach feeding cues;Waking techniques  Audible Swallowing: Spontaneous and intermittent Intervention(s): Skin to skin;Hand expression  Type of Nipple: Everted at rest and after stimulation  Comfort (Breast/Nipple): Filling, red/small blisters or bruises, mild/mod discomfort     Hold (Positioning): Assistance needed to correctly position infant at breast and maintain latch. Intervention(s): Support Pillows;Position options  LATCH Score: 8  Lactation Tools Discussed/Used     Consult Status Consult Status: Complete    Michel BickersKendrick, Kayla Deshaies McCoy [name], 12:36 PM

## 2016-03-26 ENCOUNTER — Inpatient Hospital Stay (HOSPITAL_COMMUNITY)
Admission: AD | Admit: 2016-03-26 | Payer: BLUE CROSS/BLUE SHIELD | Source: Ambulatory Visit | Admitting: Obstetrics and Gynecology

## 2016-03-30 ENCOUNTER — Ambulatory Visit
Admission: RE | Admit: 2016-03-30 | Discharge: 2016-03-30 | Disposition: A | Discharge status: Home / Self Care | Payer: BLUE CROSS/BLUE SHIELD | Source: Ambulatory Visit | Attending: Physician Assistant | Admitting: Physician Assistant

## 2016-03-30 ENCOUNTER — Other Ambulatory Visit: Payer: Self-pay | Admitting: Physician Assistant
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
## 2019-01-28 ENCOUNTER — Ambulatory Visit (HOSPITAL_COMMUNITY)
Admission: EM | Admit: 2019-01-28 | Discharge: 2019-01-28 | Disposition: A | Discharge status: Home / Self Care | Payer: 59 | Attending: Family Medicine | Admitting: Family Medicine

## 2019-01-28 ENCOUNTER — Encounter (HOSPITAL_COMMUNITY): Payer: Self-pay

## 2019-01-28 ENCOUNTER — Other Ambulatory Visit: Payer: Self-pay

## 2019-01-28 DIAGNOSIS — W278XXA Contact with other nonpowered hand tool, initial encounter: Secondary | ICD-10-CM

## 2019-01-28 DIAGNOSIS — S61214A Laceration without foreign body of right ring finger without damage to nail, initial encounter: Secondary | ICD-10-CM

## 2019-01-28 DIAGNOSIS — Z23 Encounter for immunization: Secondary | ICD-10-CM | POA: Diagnosis not present

## 2019-01-28 MED ORDER — TETANUS-DIPHTH-ACELL PERTUSSIS 5-2.5-18.5 LF-MCG/0.5 IM SUSP
INTRAMUSCULAR | Status: AC
  Filled 2019-01-28: qty 0.5

## 2019-01-28 MED ORDER — TETANUS-DIPHTH-ACELL PERTUSSIS 5-2.5-18.5 LF-MCG/0.5 IM SUSP
0.5000 mL | Freq: Once | INTRAMUSCULAR | Status: AC
  Administered 2019-01-28: 0.5 mL via INTRAMUSCULAR

## 2019-01-28 NOTE — Discharge Instructions (Signed)
WOUND CARE Please return in 7-10 days to have your stitches/staples removed or sooner if you have concerns. You have 8  Keep area clean and dry for 24 hours. Do not remove bandage, if applied.  After 24 hours, remove bandage and wash wound gently with mild soap and warm water. Reapply a new bandage after cleaning wound, if directed.  Continue daily cleansing with soap and water until stitches/staples are removed.  Do not apply any ointments or creams to the wound while stitches/staples are in place, as this may cause delayed healing.  Notify the office if you experience any of the following signs of infection: Swelling, redness, pus drainage, streaking, fever >101.0 F  Notify the office if you experience excessive bleeding that does not stop after 15-20 minutes of constant, firm pressure.

## 2019-01-28 NOTE — ED Triage Notes (Signed)
Patient presents to Urgent Care with complaints of laceration to right ring finger since cutting hedges with an electric trimmer this afternoon. Patient reports the bleeding is controlled, unknown tetanus.
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
## 2019-03-19 LAB — OB RESULTS CONSOLE HIV ANTIBODY (ROUTINE TESTING): HIV: NONREACTIVE

## 2019-03-19 LAB — OB RESULTS CONSOLE RPR: RPR: NONREACTIVE

## 2019-03-19 LAB — OB RESULTS CONSOLE RUBELLA ANTIBODY, IGM: Rubella: IMMUNE

## 2019-03-19 LAB — OB RESULTS CONSOLE GC/CHLAMYDIA
Chlamydia: NEGATIVE
Gonorrhea: NEGATIVE

## 2019-03-19 LAB — OB RESULTS CONSOLE HEPATITIS B SURFACE ANTIGEN: Hepatitis B Surface Ag: NEGATIVE

## 2019-03-28 ENCOUNTER — Other Ambulatory Visit: Payer: Self-pay

## 2019-03-28 ENCOUNTER — Ambulatory Visit (HOSPITAL_COMMUNITY)
Admission: EM | Admit: 2019-03-28 | Discharge: 2019-03-28 | Disposition: A | Discharge status: Home / Self Care | Payer: 59 | Attending: Family Medicine | Admitting: Family Medicine

## 2019-03-28 ENCOUNTER — Encounter (HOSPITAL_COMMUNITY): Payer: Self-pay

## 2019-03-28 DIAGNOSIS — R21 Rash and other nonspecific skin eruption: Secondary | ICD-10-CM | POA: Diagnosis not present

## 2019-03-28 MED ORDER — CETIRIZINE HCL 10 MG PO TABS: 10.0000 mg | ORAL_TABLET | Freq: Every day | ORAL | 0 refills | Status: DC

## 2019-03-28 MED ORDER — PREDNISONE 20 MG PO TABS
20.0000 mg | ORAL_TABLET | Freq: Every day | ORAL | 0 refills | Status: AC
Start: 2019-03-28 — End: 2019-04-02

## 2019-03-28 NOTE — ED Triage Notes (Signed)
Pt present a rash that has spread on certain parts of her body,  Both arms legs and stomach. Pt states the rash is itching and red.
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
## 2019-03-28 NOTE — Discharge Instructions (Signed)
This does appear allergic in nature, although the pattern is suggestive of heat source.  No matter, treatment is similar (although if you can avoid any known trigger of allergy that would help as well) Zyrtec daily to help with itching and potentially help with rash.  Cool compresses. Topical calamine or benadryl may help as well.  Prednisone as needed for persistent rash.  If symptoms worsen or do not improve in the next week to return to be seen or to follow up with your PCP.

## 2019-04-08 ENCOUNTER — Other Ambulatory Visit: Payer: Self-pay | Admitting: Emergency Medicine

## 2019-04-08 DIAGNOSIS — Z20822 Contact with and (suspected) exposure to covid-19: Secondary | ICD-10-CM

## 2019-04-10 LAB — NOVEL CORONAVIRUS, NAA: SARS-CoV-2, NAA: NOT DETECTED

## 2019-04-29 LAB — OB RESULTS CONSOLE ABO/RH: RH Type: POSITIVE

## 2019-04-29 LAB — OB RESULTS CONSOLE ANTIBODY SCREEN: Antibody Screen: NEGATIVE

## 2019-08-11 ENCOUNTER — Ambulatory Visit: Payer: 59 | Attending: Internal Medicine

## 2019-08-11 DIAGNOSIS — Z20822 Contact with and (suspected) exposure to covid-19: Secondary | ICD-10-CM

## 2019-08-12 ENCOUNTER — Telehealth: Payer: Self-pay

## 2019-08-12 LAB — NOVEL CORONAVIRUS, NAA: SARS-CoV-2, NAA: NOT DETECTED

## 2019-08-12 NOTE — Telephone Encounter (Signed)
Husband checking on COVID 19 results, not available.

## 2019-09-30 LAB — OB RESULTS CONSOLE GBS: GBS: NEGATIVE

## 2019-10-11 ENCOUNTER — Other Ambulatory Visit: Payer: Self-pay

## 2019-10-11 ENCOUNTER — Inpatient Hospital Stay (HOSPITAL_COMMUNITY)
Admission: AD | Admit: 2019-10-11 | Discharge: 2019-10-11 | Disposition: A | Discharge status: Home / Self Care | Payer: 59 | Attending: Obstetrics and Gynecology | Admitting: Obstetrics and Gynecology

## 2019-10-11 ENCOUNTER — Encounter (HOSPITAL_COMMUNITY): Payer: Self-pay | Admitting: Obstetrics and Gynecology

## 2019-10-11 DIAGNOSIS — O471 False labor at or after 37 completed weeks of gestation: Secondary | ICD-10-CM

## 2019-10-11 DIAGNOSIS — Z3689 Encounter for other specified antenatal screening: Secondary | ICD-10-CM

## 2019-10-11 DIAGNOSIS — Z3A38 38 weeks gestation of pregnancy: Secondary | ICD-10-CM | POA: Diagnosis not present

## 2019-10-11 NOTE — OB Triage Provider Note (Signed)
S: Ms. ALEXANDRYA CHIM is a 28 y.o. G2P1001 at [redacted]w[redacted]d  who presents to MAU today for labor evaluation.   After evaluation patient cervical exam unchanged.   Cervical exam by RN:  Dilation: Closed(outer os 1) Effacement (%): Thick Cervical Position: Anterior Exam by:: Irish Elders, RN  Fetal Monitoring: Baseline: 125 Variability: Moderate  Accelerations: Present Decelerations: Absent Contractions: Q3-68min, appears mild  MDM Discussed patient with RN. NST reviewed.   A: SIUP at [redacted]w[redacted]d  False labor Cat I FT  P: Discharge home Labor precautions and kick counts included in AVS Patient to follow-up with primary ob provder as scheduled  Patient may return to MAU as needed or when in labor   Gerrit Heck, [place] 10/11/2019 10:09 PM

## 2019-10-11 NOTE — MAU Note (Signed)
Pt reports to MAU c/o ctx every 5-10 min for the last hour and a half. No bleeding or LOF. +FM. Pt was 1cm on Wednesday.

## 2019-10-11 NOTE — Discharge Instructions (Signed)
Advance Directive  Advance directives are legal documents that let you make choices ahead of time about your health care and medical treatment in case you become unable to communicate for yourself. Advance directives are a way for you to make known your wishes to family, friends, and health care providers. This can let others know about your end-of-life care if you become unable to communicate. Discussing and writing advance directives should happen over time rather than all at once. Advance directives can be changed depending on your situation and what you want, even after you have signed the advance directives. There are different types of advance directives, such as:  Medical power of attorney.  Living will.  Do not resuscitate (DNR) or do not attempt resuscitation (DNAR) order. Health care proxy and medical power of attorney A health care proxy is also called a health care agent. This is a person who is appointed to make medical decisions for you in cases where you are unable to make the decisions yourself. Generally, people choose someone they know well and trust to represent their preferences. Make sure to ask this person for an agreement to act as your proxy. A proxy may have to exercise judgment in the event of a medical decision for which your wishes are not known. A medical power of attorney is a legal document that names your health care proxy. Depending on the laws in your state, after the document is written, it may also need to be:  Signed.  Notarized.  Dated.  Copied.  Witnessed.  Incorporated into your medical record. You may also want to appoint someone to manage your money in a situation in which you are unable to do so. This is called a durable power of attorney for finances. It is a separate legal document from the durable power of attorney for health care. You may choose the same person or someone different from your health care proxy to act as your agent in money  matters. If you do not appoint a proxy, or if there is a concern that the proxy is not acting in your best interests, a court may appoint a guardian to act on your behalf. Living will A living will is a set of instructions that state your wishes about medical care when you cannot express them yourself. Health care providers should keep a copy of your living will in your medical record. You may want to give a copy to family members or friends. To alert caregivers in case of an emergency, you can place a card in your wallet to let them know that you have a living will and where they can find it. A living will is used if you become:  Terminally ill.  Disabled.  Unable to communicate or make decisions. Items to consider in your living will include:  To use or not to use life-support equipment, such as dialysis machines and breathing machines (ventilators).  A DNR or DNAR order. This tells health care providers not to use cardiopulmonary resuscitation (CPR) if breathing or heartbeat stops.  To use or not to use tube feeding.  To be given or not to be given food and fluids.  Comfort (palliative) care when the goal becomes comfort rather than a cure.  Donation of organs and tissues. A living will does not give instructions for distributing your money and property if you should pass away. DNR or DNAR A DNR or DNAR order is a request not to have CPR in the event that   your heart stops beating or you stop breathing. If a DNR or DNAR order has not been made and shared, a health care provider will try to help any patient whose heart has stopped or who has stopped breathing. If you plan to have surgery, talk with your health care provider about how your DNR or DNAR order will be followed if problems occur. What if I do not have an advance directive? If you do not have an advance directive, some states assign family decision makers to act on your behalf based on how closely you are related to them. Each  state has its own laws about advance directives. You may want to check with your health care provider, attorney, or state representative about the laws in your state. Summary  Advance directives are the legal documents that allow you to make choices ahead of time about your health care and medical treatment in case you become unable to tell others about your care.  The process of discussing and writing advance directives should happen over time. You can change the advance directives, even after you have signed them.  Advance directives include DNR or DNAR orders, living wills, and designating an agent as your medical power of attorney. This information is not intended to replace advice given to you by your health care provider. Make sure you discuss any questions you have with your health care provider. Document Revised: 02/20/2019 Document Reviewed: 02/20/2019 Elsevier Patient Education  2020 [employer] Inc.  

## 2019-10-15 ENCOUNTER — Encounter (HOSPITAL_COMMUNITY): Payer: Self-pay | Admitting: *Deleted

## 2019-10-15 ENCOUNTER — Telehealth (HOSPITAL_COMMUNITY): Payer: Self-pay | Admitting: *Deleted

## 2019-10-15 ENCOUNTER — Other Ambulatory Visit (HOSPITAL_COMMUNITY)
Admission: RE | Admit: 2019-10-15 | Discharge: 2019-10-15 | Disposition: A | Discharge status: Home / Self Care | Payer: 59 | Source: Ambulatory Visit | Attending: Obstetrics and Gynecology | Admitting: Obstetrics and Gynecology

## 2019-10-15 LAB — SARS CORONAVIRUS 2 (TAT 6-24 HRS): SARS Coronavirus 2: NEGATIVE

## 2019-10-15 NOTE — Telephone Encounter (Signed)
Preadmission screen  

## 2019-10-15 NOTE — H&P (Signed)
Shelly [name] is a 29 y.o. female presenting for 2 stage IOL. Pregnancy complicated by Hx of asthma-no meds currently. OB History    Gravida  2   Para  1   Term  1   Preterm      AB      Living  1     SAB      TAB      Ectopic      Multiple  0   Live Births  1          Past Medical History:  Diagnosis Date  . Asthma   . ZOXWRUEA(540.9)    Past Surgical History:  Procedure Laterality Date  . TONSILLECTOMY     Family History: family history includes Cancer in her maternal grandfather and paternal grandfather; Diabetes in her maternal grandmother; Healthy in her father; Heart disease in her maternal aunt, maternal grandmother, and maternal uncle; High blood pressure in her mother. Social History:  reports that she has never smoked. She has never used smokeless tobacco. She reports previous alcohol use. She reports that she does not use drugs.     Maternal Diabetes: No Genetic Screening: Normal Maternal Ultrasounds/Referrals: Normal Fetal Ultrasounds or other Referrals:  None Maternal Substance Abuse:  No Significant Maternal Medications:  None Significant Maternal Lab Results:  Group B Strep negative Other Comments:  None  Review of Systems  Eyes: Negative for visual disturbance.  Gastrointestinal: Negative for abdominal pain.  Neurological: Negative for headaches.   Maternal Medical History:  Fetal activity: Perceived fetal activity is normal.        unknown if currently breastfeeding. Maternal Exam:  Abdomen: Fetal presentation: vertex     Physical Exam  Cardiovascular: Normal rate.  Respiratory: Effort normal.  GI: Soft.    Cx 1/50/-2 in office 10/15/19  Prenatal labs: ABO, Rh: B/Positive/-- (09/22 0000) Antibody: Negative (09/22 0000) Rubella: Immune (08/12 0000) RPR: Nonreactive (08/12 0000)  HBsAg: Negative (08/12 0000)  HIV: Non-reactive (08/12 0000)  GBS: Negative/-- (02/23 0000)   Assessment/Plan: 29 yo G2P1 @ 39 0/7 weeks  for IOL   Shelly [name] II 10/15/2019, 5:08 PM

## 2019-10-17 ENCOUNTER — Encounter (HOSPITAL_COMMUNITY): Payer: Self-pay | Admitting: Obstetrics and Gynecology

## 2019-10-17 ENCOUNTER — Inpatient Hospital Stay (HOSPITAL_COMMUNITY): Payer: 59 | Admitting: Anesthesiology

## 2019-10-17 ENCOUNTER — Inpatient Hospital Stay (HOSPITAL_COMMUNITY): Payer: 59

## 2019-10-17 ENCOUNTER — Inpatient Hospital Stay (HOSPITAL_COMMUNITY)
Admission: AD | Admit: 2019-10-17 | Discharge: 2019-10-19 | DRG: 788 | Disposition: A | Discharge status: Home / Self Care | Payer: 59 | Attending: Obstetrics and Gynecology | Admitting: Obstetrics and Gynecology

## 2019-10-17 ENCOUNTER — Encounter (HOSPITAL_COMMUNITY)
Admission: AD | Disposition: A | Discharge status: Home / Self Care | Payer: Self-pay | Source: Home / Self Care | Attending: Obstetrics and Gynecology

## 2019-10-17 ENCOUNTER — Other Ambulatory Visit: Payer: Self-pay

## 2019-10-17 DIAGNOSIS — O99214 Obesity complicating childbirth: Principal | ICD-10-CM | POA: Diagnosis present

## 2019-10-17 DIAGNOSIS — Z20822 Contact with and (suspected) exposure to covid-19: Secondary | ICD-10-CM | POA: Diagnosis present

## 2019-10-17 DIAGNOSIS — Z3490 Encounter for supervision of normal pregnancy, unspecified, unspecified trimester: Secondary | ICD-10-CM

## 2019-10-17 DIAGNOSIS — O26893 Other specified pregnancy related conditions, third trimester: Secondary | ICD-10-CM | POA: Diagnosis present

## 2019-10-17 DIAGNOSIS — Z3A39 39 weeks gestation of pregnancy: Secondary | ICD-10-CM | POA: Diagnosis not present

## 2019-10-17 LAB — CBC
HCT: 39.8 % (ref 36.0–46.0)
Hemoglobin: 12.6 g/dL (ref 12.0–15.0)
MCH: 26 pg (ref 26.0–34.0)
MCHC: 31.7 g/dL (ref 30.0–36.0)
MCV: 82.2 fL (ref 80.0–100.0)
Platelets: 212 10*3/uL (ref 150–400)
RBC: 4.84 MIL/uL (ref 3.87–5.11)
RDW: 14.6 % (ref 11.5–15.5)
WBC: 10.9 10*3/uL — ABNORMAL HIGH (ref 4.0–10.5)
nRBC: 0 % (ref 0.0–0.2)

## 2019-10-17 LAB — TYPE AND SCREEN
ABO/RH(D): B POS
Antibody Screen: NEGATIVE

## 2019-10-17 LAB — RPR: RPR Ser Ql: NONREACTIVE

## 2019-10-17 LAB — ABO/RH: ABO/RH(D): B POS

## 2019-10-17 SURGERY — Surgical Case
Anesthesia: Epidural | Site: Abdomen | Wound class: Clean Contaminated

## 2019-10-17 MED ORDER — DIBUCAINE (PERIANAL) 1 % EX OINT: 1.0000 "application " | TOPICAL_OINTMENT | CUTANEOUS | Status: DC | PRN

## 2019-10-17 MED ORDER — TETANUS-DIPHTH-ACELL PERTUSSIS 5-2.5-18.5 LF-MCG/0.5 IM SUSP: 0.5000 mL | Freq: Once | INTRAMUSCULAR | Status: DC

## 2019-10-17 MED ORDER — SENNOSIDES-DOCUSATE SODIUM 8.6-50 MG PO TABS
2.0000 | ORAL_TABLET | ORAL | Status: DC
  Administered 2019-10-17 – 2019-10-18 (×2): 2 via ORAL
  Filled 2019-10-17 (×2): qty 2

## 2019-10-17 MED ORDER — LACTATED RINGERS IV SOLN: 500.0000 mL | Freq: Once | INTRAVENOUS | Status: DC

## 2019-10-17 MED ORDER — ONDANSETRON HCL 4 MG/2ML IJ SOLN
4.0000 mg | Freq: Four times a day (QID) | INTRAMUSCULAR | Status: DC | PRN
  Administered 2019-10-17: 4 mg via INTRAVENOUS

## 2019-10-17 MED ORDER — SIMETHICONE 80 MG PO CHEW: 80.0000 mg | CHEWABLE_TABLET | ORAL | Status: DC | PRN

## 2019-10-17 MED ORDER — LIDOCAINE-EPINEPHRINE (PF) 2 %-1:200000 IJ SOLN
INTRAMUSCULAR | Status: AC
  Filled 2019-10-17: qty 10

## 2019-10-17 MED ORDER — PHENYLEPHRINE HCL-NACL 20-0.9 MG/250ML-% IV SOLN
INTRAVENOUS | Status: AC
  Filled 2019-10-17: qty 250

## 2019-10-17 MED ORDER — MORPHINE SULFATE (PF) 0.5 MG/ML IJ SOLN
INTRAMUSCULAR | Status: DC | PRN
  Administered 2019-10-17: 3 mg via EPIDURAL

## 2019-10-17 MED ORDER — SIMETHICONE 80 MG PO CHEW
80.0000 mg | CHEWABLE_TABLET | Freq: Three times a day (TID) | ORAL | Status: DC
  Administered 2019-10-18 (×3): 80 mg via ORAL
  Filled 2019-10-17 (×4): qty 1

## 2019-10-17 MED ORDER — OXYCODONE HCL 5 MG/5ML PO SOLN: 5.0000 mg | Freq: Once | ORAL | Status: DC | PRN

## 2019-10-17 MED ORDER — LACTATED RINGERS IV SOLN
500.0000 mL | INTRAVENOUS | Status: DC | PRN
  Administered 2019-10-17 (×2): 500 mL via INTRAVENOUS

## 2019-10-17 MED ORDER — ONDANSETRON HCL 4 MG/2ML IJ SOLN
INTRAMUSCULAR | Status: AC
  Filled 2019-10-17: qty 2

## 2019-10-17 MED ORDER — FENTANYL CITRATE (PF) 100 MCG/2ML IJ SOLN: 50.0000 ug | INTRAMUSCULAR | Status: DC | PRN

## 2019-10-17 MED ORDER — LACTATED RINGERS IV SOLN: INTRAVENOUS | Status: DC

## 2019-10-17 MED ORDER — ACETAMINOPHEN 325 MG PO TABS
650.0000 mg | ORAL_TABLET | ORAL | Status: DC | PRN
  Administered 2019-10-18 (×3): 650 mg via ORAL
  Filled 2019-10-17 (×5): qty 2

## 2019-10-17 MED ORDER — DIPHENHYDRAMINE HCL 25 MG PO CAPS: 25.0000 mg | ORAL_CAPSULE | ORAL | Status: DC | PRN

## 2019-10-17 MED ORDER — SODIUM CHLORIDE 0.9 % IR SOLN
Status: DC | PRN
  Administered 2019-10-17: 1000 mL

## 2019-10-17 MED ORDER — LIDOCAINE HCL (PF) 1 % IJ SOLN
30.0000 mL | INTRAMUSCULAR | Status: AC | PRN
  Administered 2019-10-17 (×2): 5 mL via SUBCUTANEOUS

## 2019-10-17 MED ORDER — DIPHENHYDRAMINE HCL 50 MG/ML IJ SOLN: 12.5000 mg | Freq: Four times a day (QID) | INTRAMUSCULAR | Status: DC | PRN

## 2019-10-17 MED ORDER — NALOXONE HCL 0.4 MG/ML IJ SOLN: 0.4000 mg | INTRAMUSCULAR | Status: DC | PRN

## 2019-10-17 MED ORDER — SCOPOLAMINE 1 MG/3DAYS TD PT72: 1.0000 | MEDICATED_PATCH | Freq: Once | TRANSDERMAL | Status: DC

## 2019-10-17 MED ORDER — OXYTOCIN 40 UNITS IN NORMAL SALINE INFUSION - SIMPLE MED
INTRAVENOUS | Status: AC
  Filled 2019-10-17: qty 1000

## 2019-10-17 MED ORDER — ACETAMINOPHEN 10 MG/ML IV SOLN
1000.0000 mg | Freq: Once | INTRAVENOUS | Status: DC | PRN
  Administered 2019-10-17: 1000 mg via INTRAVENOUS

## 2019-10-17 MED ORDER — OXYCODONE HCL 5 MG PO TABS: 5.0000 mg | ORAL_TABLET | ORAL | Status: DC | PRN

## 2019-10-17 MED ORDER — ZOLPIDEM TARTRATE 5 MG PO TABS: 5.0000 mg | ORAL_TABLET | Freq: Every evening | ORAL | Status: DC | PRN

## 2019-10-17 MED ORDER — HYDROMORPHONE HCL 1 MG/ML IJ SOLN: 0.2500 mg | INTRAMUSCULAR | Status: DC | PRN

## 2019-10-17 MED ORDER — ACETAMINOPHEN 10 MG/ML IV SOLN
INTRAVENOUS | Status: AC
  Filled 2019-10-17: qty 100

## 2019-10-17 MED ORDER — DEXAMETHASONE SODIUM PHOSPHATE 4 MG/ML IJ SOLN
INTRAMUSCULAR | Status: DC | PRN
  Administered 2019-10-17: 4 mg via INTRAVENOUS

## 2019-10-17 MED ORDER — ACETAMINOPHEN 325 MG PO TABS: 650.0000 mg | ORAL_TABLET | ORAL | Status: DC | PRN

## 2019-10-17 MED ORDER — NALBUPHINE HCL 10 MG/ML IJ SOLN: 5.0000 mg | INTRAMUSCULAR | Status: DC | PRN

## 2019-10-17 MED ORDER — STERILE WATER FOR IRRIGATION IR SOLN
Status: DC | PRN
  Administered 2019-10-17: 1000 mL

## 2019-10-17 MED ORDER — OXYTOCIN 40 UNITS IN NORMAL SALINE INFUSION - SIMPLE MED
1.0000 m[IU]/min | INTRAVENOUS | Status: DC
  Administered 2019-10-17: 1 m[IU]/min via INTRAVENOUS

## 2019-10-17 MED ORDER — NALOXONE HCL 4 MG/10ML IJ SOLN
1.0000 ug/kg/h | INTRAVENOUS | Status: DC | PRN
  Filled 2019-10-17: qty 5

## 2019-10-17 MED ORDER — DEXAMETHASONE SODIUM PHOSPHATE 4 MG/ML IJ SOLN
INTRAMUSCULAR | Status: AC
  Filled 2019-10-17: qty 7

## 2019-10-17 MED ORDER — PHENYLEPHRINE 40 MCG/ML (10ML) SYRINGE FOR IV PUSH (FOR BLOOD PRESSURE SUPPORT)
80.0000 ug | PREFILLED_SYRINGE | INTRAVENOUS | Status: DC | PRN
  Filled 2019-10-17: qty 10

## 2019-10-17 MED ORDER — OXYTOCIN 40 UNITS IN NORMAL SALINE INFUSION - SIMPLE MED: 2.5000 [IU]/h | INTRAVENOUS | Status: AC

## 2019-10-17 MED ORDER — SODIUM BICARBONATE 8.4 % IV SOLN
INTRAVENOUS | Status: DC | PRN
  Administered 2019-10-17: 3 mL via EPIDURAL
  Administered 2019-10-17: 5 mL via EPIDURAL
  Administered 2019-10-17: 7 mL via EPIDURAL

## 2019-10-17 MED ORDER — EPHEDRINE 5 MG/ML INJ: 10.0000 mg | INTRAVENOUS | Status: DC | PRN

## 2019-10-17 MED ORDER — NALBUPHINE HCL 10 MG/ML IJ SOLN: 5.0000 mg | Freq: Once | INTRAMUSCULAR | Status: AC | PRN

## 2019-10-17 MED ORDER — WITCH HAZEL-GLYCERIN EX PADS: 1.0000 "application " | MEDICATED_PAD | CUTANEOUS | Status: DC | PRN

## 2019-10-17 MED ORDER — IBUPROFEN 600 MG PO TABS
600.0000 mg | ORAL_TABLET | Freq: Four times a day (QID) | ORAL | Status: DC | PRN
  Administered 2019-10-17 – 2019-10-19 (×5): 600 mg via ORAL
  Filled 2019-10-17 (×6): qty 1

## 2019-10-17 MED ORDER — TERBUTALINE SULFATE 1 MG/ML IJ SOLN: 0.2500 mg | Freq: Once | INTRAMUSCULAR | Status: DC | PRN

## 2019-10-17 MED ORDER — NALBUPHINE HCL 10 MG/ML IJ SOLN
5.0000 mg | INTRAMUSCULAR | Status: DC | PRN
  Administered 2019-10-18: 5 mg via INTRAVENOUS
  Filled 2019-10-17 (×2): qty 1

## 2019-10-17 MED ORDER — DIPHENHYDRAMINE HCL 50 MG/ML IJ SOLN: 12.5000 mg | INTRAMUSCULAR | Status: DC | PRN

## 2019-10-17 MED ORDER — FLEET ENEMA 7-19 GM/118ML RE ENEM: 1.0000 | ENEMA | RECTAL | Status: DC | PRN

## 2019-10-17 MED ORDER — HYDROMORPHONE HCL 1 MG/ML IJ SOLN: 0.2000 mg | INTRAMUSCULAR | Status: DC | PRN

## 2019-10-17 MED ORDER — OXYTOCIN 40 UNITS IN NORMAL SALINE INFUSION - SIMPLE MED
2.5000 [IU]/h | INTRAVENOUS | Status: DC
  Filled 2019-10-17: qty 1000

## 2019-10-17 MED ORDER — METOCLOPRAMIDE HCL 5 MG/ML IJ SOLN
INTRAMUSCULAR | Status: AC
  Filled 2019-10-17: qty 2

## 2019-10-17 MED ORDER — TERBUTALINE SULFATE 1 MG/ML IJ SOLN
0.2500 mg | Freq: Once | INTRAMUSCULAR | Status: AC | PRN
  Administered 2019-10-17: 0.25 mg via SUBCUTANEOUS
  Filled 2019-10-17: qty 1

## 2019-10-17 MED ORDER — SODIUM BICARBONATE 8.4 % IV SOLN
INTRAVENOUS | Status: AC
  Filled 2019-10-17: qty 50

## 2019-10-17 MED ORDER — MENTHOL 3 MG MT LOZG: 1.0000 | LOZENGE | OROMUCOSAL | Status: DC | PRN

## 2019-10-17 MED ORDER — FENTANYL-BUPIVACAINE-NACL 0.5-0.125-0.9 MG/250ML-% EP SOLN
12.0000 mL/h | EPIDURAL | Status: DC | PRN
  Filled 2019-10-17: qty 250

## 2019-10-17 MED ORDER — FENTANYL CITRATE (PF) 100 MCG/2ML IJ SOLN
INTRAMUSCULAR | Status: DC | PRN
  Administered 2019-10-17: 100 ug via EPIDURAL

## 2019-10-17 MED ORDER — DIPHENHYDRAMINE HCL 25 MG PO CAPS: 25.0000 mg | ORAL_CAPSULE | Freq: Four times a day (QID) | ORAL | Status: DC | PRN

## 2019-10-17 MED ORDER — PRENATAL MULTIVITAMIN CH
1.0000 | ORAL_TABLET | Freq: Every day | ORAL | Status: DC
  Administered 2019-10-18: 1 via ORAL
  Filled 2019-10-17: qty 1

## 2019-10-17 MED ORDER — FENTANYL CITRATE (PF) 100 MCG/2ML IJ SOLN
INTRAMUSCULAR | Status: AC
  Filled 2019-10-17: qty 2

## 2019-10-17 MED ORDER — OXYCODONE-ACETAMINOPHEN 5-325 MG PO TABS: 2.0000 | ORAL_TABLET | ORAL | Status: DC | PRN

## 2019-10-17 MED ORDER — OXYCODONE-ACETAMINOPHEN 5-325 MG PO TABS: 1.0000 | ORAL_TABLET | ORAL | Status: DC | PRN

## 2019-10-17 MED ORDER — LACTATED RINGERS IV SOLN: INTRAVENOUS | Status: DC | PRN

## 2019-10-17 MED ORDER — SIMETHICONE 80 MG PO CHEW
80.0000 mg | CHEWABLE_TABLET | ORAL | Status: DC
  Administered 2019-10-17 – 2019-10-18 (×2): 80 mg via ORAL
  Filled 2019-10-17 (×2): qty 1

## 2019-10-17 MED ORDER — MISOPROSTOL 25 MCG QUARTER TABLET
25.0000 ug | ORAL_TABLET | ORAL | Status: DC | PRN
  Administered 2019-10-17 (×2): 25 ug via VAGINAL
  Filled 2019-10-17 (×2): qty 1

## 2019-10-17 MED ORDER — HYDROXYZINE HCL 50 MG PO TABS: 50.0000 mg | ORAL_TABLET | Freq: Four times a day (QID) | ORAL | Status: DC | PRN

## 2019-10-17 MED ORDER — SOD CITRATE-CITRIC ACID 500-334 MG/5ML PO SOLN
30.0000 mL | ORAL | Status: DC | PRN
  Administered 2019-10-17: 30 mL via ORAL
  Filled 2019-10-17: qty 30

## 2019-10-17 MED ORDER — COCONUT OIL OIL: 1.0000 "application " | TOPICAL_OIL | Status: DC | PRN

## 2019-10-17 MED ORDER — PROMETHAZINE HCL 25 MG/ML IJ SOLN: 6.2500 mg | INTRAMUSCULAR | Status: DC | PRN

## 2019-10-17 MED ORDER — OXYTOCIN BOLUS FROM INFUSION: 500.0000 mL | Freq: Once | INTRAVENOUS | Status: DC

## 2019-10-17 MED ORDER — SODIUM CHLORIDE 0.9% FLUSH: 3.0000 mL | INTRAVENOUS | Status: DC | PRN

## 2019-10-17 MED ORDER — SCOPOLAMINE 1 MG/3DAYS TD PT72
MEDICATED_PATCH | TRANSDERMAL | Status: AC
  Filled 2019-10-17: qty 1

## 2019-10-17 MED ORDER — SODIUM CHLORIDE 0.9 % IV SOLN: INTRAVENOUS | Status: DC | PRN

## 2019-10-17 MED ORDER — OXYTOCIN 10 UNIT/ML IJ SOLN
INTRAMUSCULAR | Status: DC | PRN
  Administered 2019-10-17: 40 [IU] via INTRAMUSCULAR

## 2019-10-17 MED ORDER — SCOPOLAMINE 1 MG/3DAYS TD PT72
MEDICATED_PATCH | TRANSDERMAL | Status: DC | PRN
  Administered 2019-10-17: 1 via TRANSDERMAL

## 2019-10-17 MED ORDER — METOCLOPRAMIDE HCL 5 MG/ML IJ SOLN
INTRAMUSCULAR | Status: DC | PRN
  Administered 2019-10-17: 10 mg via INTRAVENOUS

## 2019-10-17 MED ORDER — SODIUM CHLORIDE (PF) 0.9 % IJ SOLN
INTRAMUSCULAR | Status: DC | PRN
  Administered 2019-10-17: 12 mL/h via EPIDURAL

## 2019-10-17 MED ORDER — MORPHINE SULFATE (PF) 0.5 MG/ML IJ SOLN
INTRAMUSCULAR | Status: AC
  Filled 2019-10-17: qty 10

## 2019-10-17 MED ORDER — ONDANSETRON HCL 4 MG/2ML IJ SOLN: 4.0000 mg | Freq: Three times a day (TID) | INTRAMUSCULAR | Status: DC | PRN

## 2019-10-17 MED ORDER — PHENYLEPHRINE 40 MCG/ML (10ML) SYRINGE FOR IV PUSH (FOR BLOOD PRESSURE SUPPORT)
PREFILLED_SYRINGE | INTRAVENOUS | Status: AC
  Filled 2019-10-17: qty 10

## 2019-10-17 MED ORDER — ALBUTEROL SULFATE (2.5 MG/3ML) 0.083% IN NEBU: 3.0000 mL | INHALATION_SOLUTION | Freq: Four times a day (QID) | RESPIRATORY_TRACT | Status: DC | PRN

## 2019-10-17 MED ORDER — PHENYLEPHRINE 40 MCG/ML (10ML) SYRINGE FOR IV PUSH (FOR BLOOD PRESSURE SUPPORT)
80.0000 ug | PREFILLED_SYRINGE | INTRAVENOUS | Status: DC | PRN
  Administered 2019-10-17 (×2): 80 ug via INTRAVENOUS

## 2019-10-17 MED ORDER — CEFAZOLIN SODIUM-DEXTROSE 2-3 GM-%(50ML) IV SOLR
INTRAVENOUS | Status: DC | PRN
  Administered 2019-10-17: 2 g via INTRAVENOUS

## 2019-10-17 MED ORDER — NALBUPHINE HCL 10 MG/ML IJ SOLN
5.0000 mg | Freq: Once | INTRAMUSCULAR | Status: AC | PRN
  Administered 2019-10-17: 5 mg via INTRAVENOUS

## 2019-10-17 MED ORDER — OXYCODONE HCL 5 MG PO TABS: 5.0000 mg | ORAL_TABLET | Freq: Once | ORAL | Status: DC | PRN

## 2019-10-17 SURGICAL SUPPLY — 41 items
ADH SKN CLS APL DERMABOND .7 (GAUZE/BANDAGES/DRESSINGS)
APL SKNCLS STERI-STRIP NONHPOA (GAUZE/BANDAGES/DRESSINGS) ×2
BENZOIN TINCTURE PRP APPL 2/3 (GAUZE/BANDAGES/DRESSINGS) ×6 IMPLANT
CHLORAPREP W/TINT 26ML (MISCELLANEOUS) ×3 IMPLANT
CLAMP CORD UMBIL (MISCELLANEOUS) IMPLANT
CLOSURE STERI STRIP 1/2 X4 (GAUZE/BANDAGES/DRESSINGS) ×3 IMPLANT
CLOTH BEACON ORANGE TIMEOUT ST (SAFETY) ×3 IMPLANT
DERMABOND ADVANCED (GAUZE/BANDAGES/DRESSINGS)
DERMABOND ADVANCED .7 DNX12 (GAUZE/BANDAGES/DRESSINGS) IMPLANT
DRSG OPSITE POSTOP 4X10 (GAUZE/BANDAGES/DRESSINGS) ×3 IMPLANT
ELECT REM PT RETURN 9FT ADLT (ELECTROSURGICAL) ×3
ELECTRODE REM PT RTRN 9FT ADLT (ELECTROSURGICAL) ×1 IMPLANT
EXTRACTOR VACUUM M CUP 4 TUBE (SUCTIONS) IMPLANT
EXTRACTOR VACUUM M CUP 4' TUBE (SUCTIONS)
GAUZE SPONGE 4X4 12PLY STRL LF (GAUZE/BANDAGES/DRESSINGS) ×6 IMPLANT
GLOVE BIO SURGEON STRL SZ7.5 (GLOVE) ×3 IMPLANT
GLOVE BIOGEL PI IND STRL 7.0 (GLOVE) ×1 IMPLANT
GLOVE BIOGEL PI INDICATOR 7.0 (GLOVE) ×2
GOWN STRL REUS W/TWL LRG LVL3 (GOWN DISPOSABLE) ×6 IMPLANT
KIT ABG SYR 3ML LUER SLIP (SYRINGE) ×3 IMPLANT
NEEDLE HYPO 25X5/8 SAFETYGLIDE (NEEDLE) ×3 IMPLANT
NS IRRIG 1000ML POUR BTL (IV SOLUTION) ×3 IMPLANT
PACK C SECTION WH (CUSTOM PROCEDURE TRAY) ×3 IMPLANT
PAD ABD 7.5X8 STRL (GAUZE/BANDAGES/DRESSINGS) ×3 IMPLANT
PAD OB MATERNITY 4.3X12.25 (PERSONAL CARE ITEMS) ×3 IMPLANT
PENCIL SMOKE EVAC W/HOLSTER (ELECTROSURGICAL) ×3 IMPLANT
SPONGE GAUZE 4X4 12PLY STER LF (GAUZE/BANDAGES/DRESSINGS) ×6 IMPLANT
SPONGE SURGIFOAM ABS GEL 12-7 (HEMOSTASIS) ×3 IMPLANT
STRIP CLOSURE SKIN 1/2X4 (GAUZE/BANDAGES/DRESSINGS) ×2 IMPLANT
SUT CHROMIC 2 0 SH (SUTURE) ×6 IMPLANT
SUT MNCRL 0 VIOLET CTX 36 (SUTURE) ×4 IMPLANT
SUT MONOCRYL 0 CTX 36 (SUTURE) ×12
SUT PDS AB 0 CTX 60 (SUTURE) ×3 IMPLANT
SUT PLAIN 0 NONE (SUTURE) IMPLANT
SUT PLAIN 2 0 (SUTURE)
SUT PLAIN 2 0 XLH (SUTURE) ×3 IMPLANT
SUT PLAIN ABS 2-0 CT1 27XMFL (SUTURE) IMPLANT
SUT VIC AB 4-0 KS 27 (SUTURE) ×3 IMPLANT
TOWEL OR 17X24 6PK STRL BLUE (TOWEL DISPOSABLE) ×3 IMPLANT
TRAY FOLEY W/BAG SLVR 14FR LF (SET/KITS/TRAYS/PACK) ×3 IMPLANT
WATER STERILE IRR 1000ML POUR (IV SOLUTION) ×3 IMPLANT

## 2019-10-17 NOTE — Progress Notes (Signed)
FHT recurrent late decelerations. Terbutaline SQ x 1. FHT recovered well and cervix without change. Pitocin @ 1 mU >recurrent late decelerations. Cx no change A/P: Fetal Intolerance of Labor Recommend cesarean section. D/W procedure and risks including infection, organ damage, bleeding/transfusion-HIV/Hep, DVT/PE, pneumonia. Patient states she understands and agrees

## 2019-10-17 NOTE — Brief Op Note (Signed)
10/17/2019  3:39 PM  PATIENT:  Shelly [name]  29 y.o. female  PRE-OPERATIVE DIAGNOSIS:  cesarean section, fetal intolerance of labor  POST-OPERATIVE DIAGNOSIS:  cesarean section, fetal intolerance of labor  PROCEDURE:  Procedure(s): CESAREAN SECTION (N/A)  SURGEON:  Surgeon(s) and Role:    * Harold Hedge, MD - Primary  PHYSICIAN ASSISTANT:   ASSISTANTS: none   ANESTHESIA:   epidural  EBL:  Per anesthesia note  BLOOD ADMINISTERED:none  DRAINS: Urinary Catheter (Foley)   LOCAL MEDICATIONS USED:  NONE  SPECIMEN:  Source of Specimen:  placenta  DISPOSITION OF SPECIMEN:  PATHOLOGY  COUNTS:  YES  TOURNIQUET:  * No tourniquets in log *  DICTATION: .Other Dictation: Dictation Number [id number]  PLAN OF CARE: Admit to inpatient   PATIENT DISPOSITION:  PACU - hemodynamically stable.   Delay start of Pharmacological VTE agent (>24hrs) due to surgical blood loss or risk of bleeding: not applicable

## 2019-10-17 NOTE — Transfer of Care (Signed)
Immediate Anesthesia Transfer of Care Note  Patient: Shelly [name]  Procedure(s) Performed: CESAREAN SECTION (N/A Abdomen)  Patient Location: PACU  Anesthesia Type:Epidural  Level of Consciousness: awake, alert  and oriented  Airway & Oxygen Therapy: Patient Spontanous Breathing  Post-op Assessment: Report given to RN and Post -op Vital signs reviewed and stable  Post vital signs: Reviewed and stable  Last Vitals:  Vitals Value Taken Time  BP 100/72 10/17/19 1555  Temp    Pulse 92 10/17/19 1556  Resp 12 10/17/19 1556  SpO2 98 % 10/17/19 1556  Vitals shown include unvalidated device data.  Last Pain:  Vitals:   10/17/19 1302  TempSrc: Oral  PainSc:          Complications: No apparent anesthesia complications

## 2019-10-17 NOTE — Anesthesia Procedure Notes (Signed)
Epidural Patient location during procedure: OB Start time: 10/17/2019 9:16 AM End time: 10/17/2019 9:26 AM  Staffing Anesthesiologist: Leonides Grills, MD Performed: anesthesiologist   Preanesthetic Checklist Completed: patient identified, IV checked, site marked, risks and benefits discussed, monitors and equipment checked, pre-op evaluation and timeout performed  Epidural Patient position: sitting Prep: DuraPrep Patient monitoring: heart rate, cardiac monitor, continuous pulse ox and blood pressure Approach: midline Location: L3-L4 Injection technique: LOR air  Needle:  Needle type: Tuohy  Needle gauge: 17 G Needle length: 9 cm Needle insertion depth: 8 cm Catheter type: closed end flexible Catheter size: 19 Gauge Catheter at skin depth: 14 cm Test dose: negative and Other  Assessment Events: blood not aspirated, injection not painful, no injection resistance and negative IV test  Additional Notes Informed consent obtained prior to proceeding including risk of failure, 1% risk of PDPH, risk of minor discomfort and bruising. Discussed alternatives to epidural analgesia and patient desires to proceed.  Timeout performed pre-procedure verifying patient name, procedure, and platelet count.  Patient tolerated procedure well. Reason for block:procedure for pain

## 2019-10-17 NOTE — Progress Notes (Signed)
Last Cytotec about 5 am FHT - there were some variable decels, now cat one UCs irregular D/W patient will reevaluate about 9 am

## 2019-10-17 NOTE — Anesthesia Preprocedure Evaluation (Signed)
Anesthesia Evaluation  Patient identified by MRN, date of birth, ID band Patient awake    Reviewed: Allergy & Precautions, H&P , NPO status , Patient's Chart, lab work & pertinent test results  History of Anesthesia Complications Negative for: history of anesthetic complications  Airway Mallampati: II  TM Distance: >3 FB Neck ROM: full    Dental no notable dental hx. (+) Teeth Intact   Pulmonary asthma ,    Pulmonary exam normal breath sounds clear to auscultation       Cardiovascular negative cardio ROS Normal cardiovascular exam Rhythm:regular Rate:Normal     Neuro/Psych  Headaches, negative psych ROS   GI/Hepatic negative GI ROS, Neg liver ROS,   Endo/Other  Morbid obesity  Renal/GU negative Renal ROS  negative genitourinary   Musculoskeletal   Abdominal   Peds  Hematology negative hematology ROS (+)   Anesthesia Other Findings   Reproductive/Obstetrics (+) Pregnancy                             Anesthesia Physical Anesthesia Plan  ASA: III  Anesthesia Plan: Epidural   Post-op Pain Management:    Induction:   PONV Risk Score and Plan:   Airway Management Planned:   Additional Equipment:   Intra-op Plan:   Post-operative Plan:   Informed Consent: I have reviewed the patients History and Physical, chart, labs and discussed the procedure including the risks, benefits and alternatives for the proposed anesthesia with the patient or authorized representative who has indicated his/her understanding and acceptance.       Plan Discussed with:   Anesthesia Plan Comments:         Anesthesia Quick Evaluation

## 2019-10-17 NOTE — Op Note (Signed)
Shelly [name], Shelly [name] MEDICAL RECORD [id number] ACCOUNT [id number] DATE OF BIRTH:October 25, 1990 FACILITY: [facility] LOCATION: [facility] PHYSICIAN:Amaya Blakeman Jamal Collin II, MD  OPERATIVE REPORT  DATE OF PROCEDURE:  10/17/2019  PREOPERATIVE DIAGNOSIS:  Fetal intolerance of labor.  POSTOPERATIVE DIAGNOSIS:  Fetal intolerance of labor.  PROCEDURE:  Low transverse cesarean section.  SURGEON:  Harold Hedge, MD   ANESTHESIA:  Epidural.  ESTIMATED BLOOD LOSS:  450 mL.  SPECIMENS:  Placenta to pathology.  FINDINGS:  Viable female infant.  Apgars, arterial cord pH, birth weight pending.  INDICATIONS AND CONSENT:  This patient is a 29 year old G2 P1 at 28 and 0/7 weeks who was admitted for induction of labor.  She progressed to 4 cm dilation.  Vertex remained at the -2 to -3 station.  There were repetitive late decelerations and the baby  would not tolerate labor.  Recommendation for cesarean section was made.  Potential risks and complications were discussed preoperatively, including but not limited to infection, organ damage, bleeding requiring transfusion of blood products with HIV and  hepatitis acquisition, DVT, PE, pneumonia, wound breakdown.  The patient states she understands and agrees.  Consent was signed on the chart.  DESCRIPTION OF PROCEDURE:  The patient was taken to the operating room where she was identified, placed in the dorsal supine position and her epidural was augmented to a surgical level.  She was prepped vaginally.  A Foley catheter was then placed and  she was prepped abdominally with ChloraPrep.  After a 3-minute drying time, timeout was undertaken and she was draped in a sterile fashion.  After testing for adequate epidural anesthesia, skin was entered through a Pfannenstiel incision and dissection  was carried out in layers to the peritoneum, which was taken down superiorly and inferiorly.  Vesicouterine peritoneum was taken down cephalad laterally.  Uterus was incised in a low  transverse manner, the uterine cavity was entered bluntly with a  hemostat.  The uterine incision was extended with the fingers.  Vertex was delivered.  Nuchal cord x1 was noted, which was reduced and the baby was delivered.  Good cry and tone was noted.  After 1 minute, the cord was clamped and cut and the baby was  handed to waiting pediatrics team.  Placenta was manually delivered and sent to pathology.  Uterine cavity was clean.  Uterus was closed in 2 running locking imbricating layers of 0 Monocryl suture.  Complete hemostasis was obtained with 2-0 chromic as  well.  A small piece of Surgicel was placed on the lower uterine segment as well.  Lavage was carried out and all returned as clear.  Tubes and ovaries are normal bilaterally.  Anterior peritoneum was closed in a running fashion with 0 Monocryl suture,  which was also used to reapproximate the pyramidalis muscle in the midline.  Anterior rectus fascia was closed in a running fashion with a 0 looped PDS.  Subcutaneous layer was closed with interrupted plain and the skin was closed in a subcuticular  fashion with a Vicryl on a Keith needle.  Benzoin, Steri-Strips, honeycomb and pressure dressing are applied.  All counts were correct and the patient was taken to the recovery room in stable condition.  VN/NUANCE  D:10/17/2019 T:10/17/2019 JOB:[id number]

## 2019-10-17 NOTE — Progress Notes (Signed)
Cx 3/70/-2 AROM clear FHT-some variable decels now resolved UCs irregular Epidural prn Pitocin as needed D/W patient

## 2019-10-18 LAB — CBC
HCT: 33.9 % — ABNORMAL LOW (ref 36.0–46.0)
Hemoglobin: 11.1 g/dL — ABNORMAL LOW (ref 12.0–15.0)
MCH: 26.5 pg (ref 26.0–34.0)
MCHC: 32.7 g/dL (ref 30.0–36.0)
MCV: 80.9 fL (ref 80.0–100.0)
Platelets: 181 10*3/uL (ref 150–400)
RBC: 4.19 MIL/uL (ref 3.87–5.11)
RDW: 14.6 % (ref 11.5–15.5)
WBC: 11.9 10*3/uL — ABNORMAL HIGH (ref 4.0–10.5)
nRBC: 0 % (ref 0.0–0.2)

## 2019-10-18 NOTE — Progress Notes (Signed)
Subjective: Postpartum Day 1: Cesarean Delivery Patient reports tolerating PO.    Objective: Vital signs in last 24 hours: Temp:  [98 F (36.7 C)-98.9 F (37.2 C)] 98.5 F (36.9 C) (03/13 0326) Pulse Rate:  [62-112] 67 (03/13 0326) Resp:  [12-26] 18 (03/13 0326) BP: (98-143)/(22-86) 99/51 (03/13 0326) SpO2:  [96 %-100 %] 98 % (03/12 2350)  Physical Exam:  General: alert, cooperative and no distress Lochia: appropriate Uterine Fundus: firm Incision: healing well DVT Evaluation: No evidence of DVT seen on physical exam.  Recent Labs    10/17/19 0027 10/18/19 0412  HGB 12.6 11.1*  HCT 39.8 33.9*    Assessment/Plan: Status post Cesarean section. Doing well postoperatively.  Continue current care. D/W circumcision of newborn son, risks reviewed. Patient states she understands and agrees Shelly [name] II 10/18/2019, 6:59 AM

## 2019-10-18 NOTE — Anesthesia Postprocedure Evaluation (Signed)
Anesthesia Post Note  Patient: Shelly [name]  Procedure(s) Performed: CESAREAN SECTION (N/A Abdomen)     Patient location during evaluation: PACU Anesthesia Type: Epidural Level of consciousness: awake and alert Pain management: pain level controlled Vital Signs Assessment: post-procedure vital signs reviewed and stable Respiratory status: spontaneous breathing, nonlabored ventilation and respiratory function stable Cardiovascular status: stable Postop Assessment: no headache, no backache and epidural receding Anesthetic complications: no    Last Vitals:  Vitals:   10/17/19 2356 10/18/19 0326  BP: (!) 107/55 (!) 99/51  Pulse: 72 67  Resp: 18 18  Temp:  36.9 C  SpO2:      Last Pain:  Vitals:   10/18/19 0622  TempSrc:   PainSc: 3    Pain Goal:                   Leonides Grills

## 2019-10-18 NOTE — Lactation Note (Signed)
This note was copied from a baby's chart. Lactation Consultation Note Baby 12 hrs old. Mom stated has BF well. Baby has been sleepy at the breast. Baby latched well in football hold. Newborn behavior, STS, I&O, breast massage, positioning, supply and demand. Mom has large breast w/short shaft everted nipples, breast compressible. Hand expression w/colostrum noted. Mom latched baby in cradle position. Mentioned football position to obtain deeper latch if having trouble. Baby was sinking into breast. Discussed safety nose touching breast but not being covered by breast. Mom BF her now 31 yr old for 9 months w/o difficulty. Mom encouraged to feed baby 8-12 times/24 hours and with feeding cues.  Encouraged mom to call for assistance or questions. Lactation brochure given.  Patient Name: Shelly [name] [id number] Date: 10/18/2019 Reason for consult: Initial assessment;Term   Maternal Data Has patient been taught Hand Expression?: Yes Does the patient have breastfeeding experience prior to this delivery?: Yes  Feeding Feeding Type: Breast Fed  LATCH Score Latch: Grasps breast easily, tongue down, lips flanged, rhythmical sucking.  Audible Swallowing: A few with stimulation  Type of Nipple: Everted at rest and after stimulation  Comfort (Breast/Nipple): Soft / non-tender  Hold (Positioning): Assistance needed to correctly position infant at breast and maintain latch.  LATCH Score: 8  Interventions Interventions: Breast feeding basics reviewed;Support pillows;Assisted with latch;Position options;Skin to skin;Expressed milk;Breast massage;Hand express;Breast compression;Adjust position  Lactation Tools Discussed/Used WIC Program: No   Consult Status Consult Status: Follow-up Date: 10/19/19 Follow-up type: In-patient    Charyl Dancer 10/18/2019, 3:44 AM

## 2019-10-19 MED ORDER — OXYCODONE HCL 5 MG PO TABS: 5.0000 mg | ORAL_TABLET | Freq: Four times a day (QID) | ORAL | 0 refills | Status: DC | PRN

## 2019-10-19 MED ORDER — ACETAMINOPHEN 325 MG PO TABS: 650.0000 mg | ORAL_TABLET | Freq: Four times a day (QID) | ORAL | 0 refills | Status: AC | PRN

## 2019-10-19 MED ORDER — IBUPROFEN 600 MG PO TABS: 600.0000 mg | ORAL_TABLET | Freq: Four times a day (QID) | ORAL | 0 refills | Status: DC | PRN

## 2019-10-19 NOTE — Discharge Summary (Signed)
Obstetric Discharge Summary Reason for Admission: induction of labor Prenatal Procedures: none Intrapartum Procedures: cesarean: low cervical, transverse Postpartum Procedures: none Complications-Operative and Postpartum: none Hemoglobin  Date Value Ref Range Status  10/18/2019 11.1 (L) 12.0 - 15.0 g/dL Final   HCT  Date Value Ref Range Status  10/18/2019 33.9 (L) 36.0 - 46.0 % Final    Physical Exam:  General: alert, cooperative and no distress Lochia: appropriate Uterine Fundus: firm Incision: healing well DVT Evaluation: No evidence of DVT seen on physical exam.  Discharge Diagnoses: Term Pregnancy-delivered  Discharge Information: Date: 10/19/2019 Activity: pelvic rest Diet: routine Medications: PNV, Ibuprofen and oxycodone Condition: stable Instructions: refer to practice specific booklet Discharge to: home   Newborn Data: Live born female  Birth Weight: 7 lb 2.1 oz (3235 g) APGAR: 8, 9  Newborn Delivery   Birth date/time: 10/17/2019 14:58:00 Delivery type: C-Section, Low Transverse Trial of labor: Yes C-section categorization: Primary      Home with mother.  [name] 10/19/2019, 8:20 AM

## 2019-10-21 LAB — SURGICAL PATHOLOGY

## 2021-09-01 ENCOUNTER — Other Ambulatory Visit: Payer: Self-pay

## 2021-09-01 ENCOUNTER — Other Ambulatory Visit: Payer: Self-pay | Admitting: Podiatry

## 2021-09-01 ENCOUNTER — Ambulatory Visit (INDEPENDENT_AMBULATORY_CARE_PROVIDER_SITE_OTHER): Payer: POS | Admitting: Podiatry

## 2021-09-01 ENCOUNTER — Ambulatory Visit (INDEPENDENT_AMBULATORY_CARE_PROVIDER_SITE_OTHER): Payer: POS

## 2021-09-01 DIAGNOSIS — M722 Plantar fascial fibromatosis: Secondary | ICD-10-CM

## 2021-09-01 DIAGNOSIS — M79671 Pain in right foot: Secondary | ICD-10-CM

## 2021-09-06 NOTE — Progress Notes (Signed)
°  Subjective:  Patient ID: Shelly [name], female    DOB: 08-30-1990,  MRN: [id number]  No chief complaint on file.   31 y.o. female presents with the above complaint.  Patient presents with complaint bilateral heel pain mostly on the left side.  Patient states worse in the morning there is a burning sensation.  Pain scale is 8 out of 10 hurts with ambulation.  Hurts with pressure.  She would like to discuss treatment options she has not seen MRIs prior to seeing me.  She denies any other acute complaints.   Review of Systems: Negative except as noted in the HPI. Denies N/V/F/Ch.  Past Medical History:  Diagnosis Date   Asthma    Headache(784.0)     Current Outpatient Medications:    acetaminophen (TYLENOL) 325 MG tablet, Take 2 tablets (650 mg total) by mouth every 6 (six) hours as needed for mild pain (temperature > 101.5.)., Disp: 60 tablet, Rfl: 0   albuterol (VENTOLIN HFA) 108 (90 Base) MCG/ACT inhaler, Inhale 2 puffs into the lungs every 6 (six) hours as needed for wheezing or shortness of breath., Disp: , Rfl:    ibuprofen (ADVIL) 600 MG tablet, Take 1 tablet (600 mg total) by mouth every 6 (six) hours as needed for moderate pain., Disp: 60 tablet, Rfl: 0   oxyCODONE (OXY IR/ROXICODONE) 5 MG immediate release tablet, Take 1 tablet (5 mg total) by mouth every 6 (six) hours as needed for moderate pain., Disp: 10 tablet, Rfl: 0   Prenatal Vit-Fe Fumarate-FA (PRENATAL MULTIVITAMIN) TABS tablet, Take 1 tablet by mouth daily at 12 noon., Disp: , Rfl:   Social History   Tobacco Use  Smoking Status Never  Smokeless Tobacco Never    No Known Allergies Objective:  There were no vitals filed for this visit. There is no height or weight on file to calculate BMI. Constitutional Well developed. Well nourished.  Vascular Dorsalis pedis pulses palpable bilaterally. Posterior tibial pulses palpable bilaterally. Capillary refill normal to all digits.  No cyanosis or clubbing noted. Pedal  hair growth normal.  Neurologic Normal speech. Oriented to person, place, and time. Epicritic sensation to light touch grossly present bilaterally.  Dermatologic Nails well groomed and normal in appearance. No open wounds. No skin lesions.  Orthopedic: Normal joint ROM without pain or crepitus bilaterally. No visible deformities. Tender to palpation at the calcaneal tuber bilaterally. No pain with calcaneal squeeze bilaterally. Ankle ROM diminished range of motion bilaterally. Silfverskiold Test: positive bilaterally.   Radiographs: Taken and reviewed. No acute fractures or dislocations. No evidence of stress fracture.  Plantar heel spur absent. Posterior heel spur absent.   Assessment:   1. Plantar fasciitis of right foot   2. Plantar fasciitis of left foot    Plan:  Patient was evaluated and treated and all questions answered.  Plantar Fasciitis, bilaterally - XR reviewed as above.  - Educated on icing and stretching. Instructions given.  - Injection delivered to the plantar fascia as below. - DME: Plantar fascial brace dispensed to support the medial longitudinal arch of the foot and offload pressure from the heel and prevent arch collapse during weightbearing - Pharmacologic management: None  Procedure: Injection Tendon/Ligament Location: Bilateral plantar fascia at the glabrous junction; medial approach. Skin Prep: alcohol Injectate: 0.5 cc 0.5% marcaine plain, 0.5 cc of 1% Lidocaine, 0.5 cc kenalog 10. Disposition: Patient tolerated procedure well. Injection site dressed with a band-aid.  No follow-ups on file.

## 2021-09-30 ENCOUNTER — Other Ambulatory Visit: Payer: Self-pay

## 2021-09-30 ENCOUNTER — Ambulatory Visit (INDEPENDENT_AMBULATORY_CARE_PROVIDER_SITE_OTHER): Payer: POS | Admitting: Podiatry

## 2021-09-30 DIAGNOSIS — M21961 Unspecified acquired deformity of right lower leg: Secondary | ICD-10-CM

## 2021-09-30 DIAGNOSIS — M21962 Unspecified acquired deformity of left lower leg: Secondary | ICD-10-CM | POA: Diagnosis not present

## 2021-09-30 DIAGNOSIS — M722 Plantar fascial fibromatosis: Secondary | ICD-10-CM | POA: Diagnosis not present

## 2021-10-04 ENCOUNTER — Telehealth: Payer: Self-pay

## 2021-10-04 NOTE — Telephone Encounter (Signed)
Casts sent to central fabrication °

## 2021-10-06 NOTE — Progress Notes (Signed)
Subjective:  Patient ID: Shelly [name], female    DOB: [birth year],  MRN: [id number]  Chief Complaint  Patient presents with   Plantar Fasciitis    Pt stated that she still has some pain     31 y.o. female presents with the above complaint.  Patient presents for follow-up of bilateral Planter fasciitis.  Patient states that the injection helped some she still has some residual pain.  She wanted to know what the next treatment plans.  She does not wear orthotics.  She denies any other acute complaints.   Review of Systems: Negative except as noted in the HPI. Denies N/V/F/Ch.  Past Medical History:  Diagnosis Date   Asthma    Headache(784.0)     Current Outpatient Medications:    acetaminophen (TYLENOL) 325 MG tablet, Take 2 tablets (650 mg total) by mouth every 6 (six) hours as needed for mild pain (temperature > 101.5.)., Disp: 60 tablet, Rfl: 0   albuterol (VENTOLIN HFA) 108 (90 Base) MCG/ACT inhaler, Inhale 2 puffs into the lungs every 6 (six) hours as needed for wheezing or shortness of breath., Disp: , Rfl:    ibuprofen (ADVIL) 600 MG tablet, Take 1 tablet (600 mg total) by mouth every 6 (six) hours as needed for moderate pain., Disp: 60 tablet, Rfl: 0   oxyCODONE (OXY IR/ROXICODONE) 5 MG immediate release tablet, Take 1 tablet (5 mg total) by mouth every 6 (six) hours as needed for moderate pain., Disp: 10 tablet, Rfl: 0   Prenatal Vit-Fe Fumarate-FA (PRENATAL MULTIVITAMIN) TABS tablet, Take 1 tablet by mouth daily at 12 noon., Disp: , Rfl:   Social History   Tobacco Use  Smoking Status Never  Smokeless Tobacco Never    No Known Allergies Objective:  There were no vitals filed for this visit. There is no height or weight on file to calculate BMI. Constitutional Well developed. Well nourished.  Vascular Dorsalis pedis pulses palpable bilaterally. Posterior tibial pulses palpable bilaterally. Capillary refill normal to all digits.  No cyanosis or clubbing noted. Pedal  hair growth normal.  Neurologic Normal speech. Oriented to person, place, and time. Epicritic sensation to light touch grossly present bilaterally.  Dermatologic Nails well groomed and normal in appearance. No open wounds. No skin lesions.  Orthopedic: Normal joint ROM without pain or crepitus bilaterally. No visible deformities. Tender to palpation at the calcaneal tuber bilaterally. No pain with calcaneal squeeze bilaterally. Ankle ROM diminished range of motion bilaterally. Silfverskiold Test: positive bilaterally.   Radiographs: Taken and reviewed. No acute fractures or dislocations. No evidence of stress fracture.  Plantar heel spur absent. Posterior heel spur absent.   Assessment:   1. Deformity of both feet   2. Plantar fasciitis of right foot   3. Plantar fasciitis of left foot     Plan:  Patient was evaluated and treated and all questions answered.  Plantar Fasciitis, bilaterally - XR reviewed as above.  - Educated on icing and stretching. Instructions given.  -Second injection delivered to the plantar fascia as below. - DME: Plantar fascial brace dispensed to support the medial longitudinal arch of the foot and offload pressure from the heel and prevent arch collapse during weightbearing - Pharmacologic management: None  Pes planovalgus/foot deformity -I explained to patient the etiology of pes planovalgus and relationship with Planter fasciitis and various treatment options were discussed.  Given patient foot structure in the setting of Planter fasciitis I believe patient will benefit from custom-made orthotics to help control the hindfoot motion  support the arch of the foot and take the stress away from plantar fascial.  Patient agrees with the plan like to proceed with orthotics -Patient was casted for orthotics   Procedure: Injection Tendon/Ligament Location: Bilateral plantar fascia at the glabrous junction; medial approach. Skin Prep: alcohol Injectate: 0.5 cc  0.5% marcaine plain, 0.5 cc of 1% Lidocaine, 0.5 cc kenalog 10. Disposition: Patient tolerated procedure well. Injection site dressed with a band-aid.  No follow-ups on file.

## 2021-10-26 ENCOUNTER — Ambulatory Visit: Payer: POS

## 2021-10-26 ENCOUNTER — Other Ambulatory Visit: Payer: Self-pay

## 2021-10-26 ENCOUNTER — Ambulatory Visit (INDEPENDENT_AMBULATORY_CARE_PROVIDER_SITE_OTHER): Payer: POS | Admitting: Podiatry

## 2021-10-26 DIAGNOSIS — M722 Plantar fascial fibromatosis: Secondary | ICD-10-CM

## 2021-10-26 DIAGNOSIS — Q666 Other congenital valgus deformities of feet: Secondary | ICD-10-CM | POA: Diagnosis not present

## 2021-10-26 DIAGNOSIS — M21961 Unspecified acquired deformity of right lower leg: Secondary | ICD-10-CM

## 2021-10-26 NOTE — Progress Notes (Signed)
SITUATION: ?Reason for Visit: Fitting and Delivery of Custom Fabricated Foot Orthoses ?Patient Report: Patient reports comfort and is satisfied with device. ? ?OBJECTIVE DATA: ?Patient History / Diagnosis:   ?  ICD-10-CM   ?1. Deformity of both feet  M21.961   ? [id number]   ?  ?2. Plantar fasciitis of right foot  M72.2   ?  ?3. Plantar fasciitis of left foot  M72.2   ?  ? ? ?Provided Device:  Custom Functional Foot Orthotics ?    [employer]: [id number] ? ?GOAL OF ORTHOSIS ?- Improve gait ?- Decrease energy expenditure ?- Improve Balance ?- Provide Triplanar stability of foot complex ?- Facilitate motion ? ?ACTIONS PERFORMED ?Patient was fit with foot orthotics trimmed to shoe last. Patient tolerated fittign procedure.  ? ?Patient was provided with verbal and written instruction and demonstration regarding donning, doffing, wear, care, proper fit, function, purpose, cleaning, and use of the orthosis and in all related precautions and risks and benefits regarding the orthosis. ? ?Patient was also provided with verbal instruction regarding how to report any failures or malfunctions of the orthosis and necessary follow up care. Patient was also instructed to contact our office regarding any change in status that may affect the function of the orthosis. ? ?Patient demonstrated independence with proper donning, doffing, and fit and verbalized understanding of all instructions. ? ?PLAN: ?Patient is to follow up in one week or as necessary (PRN). All questions were answered and concerns addressed. Plan of care was discussed with and agreed upon by the patient. ? ?

## 2021-11-01 ENCOUNTER — Encounter: Payer: Self-pay | Admitting: Podiatry

## 2021-11-01 NOTE — Progress Notes (Signed)
?Subjective:  ?Patient ID: Shelly [name], female    DOB: 1990-11-27,  MRN: [id number] ? ?Chief Complaint  ?Patient presents with  ? Plantar Fasciitis  ? ? ?31 y.o. female presents with the above complaint.  Patient presents for follow-up of bilateral Planter fasciitis.  She states the injection helped.  She still has a little residual pain.  She would like to do 1 more injection.  She denies any other acute complaints.  She is also here for her orthotics as well. ? ?Review of Systems: Negative except as noted in the HPI. Denies N/V/F/Ch. ? ?Past Medical History:  ?Diagnosis Date  ? Asthma   ? Headache(784.0)   ? ? ?Current Outpatient Medications:  ?  acetaminophen (TYLENOL) 325 MG tablet, Take 2 tablets (650 mg total) by mouth every 6 (six) hours as needed for mild pain (temperature > 101.5.)., Disp: 60 tablet, Rfl: 0 ?  albuterol (VENTOLIN HFA) 108 (90 Base) MCG/ACT inhaler, Inhale 2 puffs into the lungs every 6 (six) hours as needed for wheezing or shortness of breath., Disp: , Rfl:  ?  ibuprofen (ADVIL) 600 MG tablet, Take 1 tablet (600 mg total) by mouth every 6 (six) hours as needed for moderate pain., Disp: 60 tablet, Rfl: 0 ?  oxyCODONE (OXY IR/ROXICODONE) 5 MG immediate release tablet, Take 1 tablet (5 mg total) by mouth every 6 (six) hours as needed for moderate pain., Disp: 10 tablet, Rfl: 0 ?  Prenatal Vit-Fe Fumarate-FA (PRENATAL MULTIVITAMIN) TABS tablet, Take 1 tablet by mouth daily at 12 noon., Disp: , Rfl:  ? ?Social History  ? ?Tobacco Use  ?Smoking Status Never  ?Smokeless Tobacco Never  ? ? ?No Known Allergies ?Objective:  ?There were no vitals filed for this visit. ?There is no height or weight on file to calculate BMI. ?Constitutional Well developed. ?Well nourished.  ?Vascular Dorsalis pedis pulses palpable bilaterally. ?Posterior tibial pulses palpable bilaterally. ?Capillary refill normal to all digits.  ?No cyanosis or clubbing noted. ?Pedal hair growth normal.  ?Neurologic Normal  speech. ?Oriented to person, place, and time. ?Epicritic sensation to light touch grossly present bilaterally.  ?Dermatologic Nails well groomed and normal in appearance. ?No open wounds. ?No skin lesions.  ?Orthopedic: Normal joint ROM without pain or crepitus bilaterally. ?No visible deformities. ?Tender to palpation at the calcaneal tuber bilaterally. ?No pain with calcaneal squeeze bilaterally. ?Ankle ROM diminished range of motion bilaterally. ?Silfverskiold Test: positive bilaterally.  ? ?Radiographs: Taken and reviewed. No acute fractures or dislocations. No evidence of stress fracture.  Plantar heel spur absent. Posterior heel spur absent.  ? ?Assessment:  ? ?1. Plantar fasciitis of right foot   ?2. Plantar fasciitis of left foot   ?3. Pes planovalgus   ? ? ? ?Plan:  ?Patient was evaluated and treated and all questions answered. ? ?Plantar Fasciitis, bilaterally ?- XR reviewed as above.  ?- Educated on icing and stretching. Instructions given.  ?-Third injection delivered to the plantar fascia as below. ?- DME: Plantar fascial brace dispensed to support the medial longitudinal arch of the foot and offload pressure from the heel and prevent arch collapse during weightbearing ?- Pharmacologic management: None ? ?Pes planovalgus/foot deformity ?-I explained to patient the etiology of pes planovalgus and relationship with Planter fasciitis and various treatment options were discussed.  Given patient foot structure in the setting of Planter fasciitis I believe patient will benefit from custom-made orthotics to help control the hindfoot motion support the arch of the foot and take the stress away  from plantar fascial.  Patient agrees with the plan like to proceed with orthotics ?-Orthotics were dispensed and are functioning well. ? ? ?Procedure: Injection Tendon/Ligament ?Location: Bilateral plantar fascia at the glabrous junction; medial approach. ?Skin Prep: alcohol ?Injectate: 0.5 cc 0.5% marcaine plain, 0.5 cc  of 1% Lidocaine, 0.5 cc kenalog 10. ?Disposition: Patient tolerated procedure well. Injection site dressed with a band-aid. ? ?No follow-ups on file. ?

## 2021-11-30 ENCOUNTER — Ambulatory Visit: Payer: POS | Admitting: Podiatry

## 2022-04-11 ENCOUNTER — Ambulatory Visit (HOSPITAL_COMMUNITY): Payer: Self-pay | Attending: Family Medicine

## 2022-04-11 ENCOUNTER — Encounter (HOSPITAL_COMMUNITY): Payer: Self-pay | Admitting: Emergency Medicine

## 2022-04-11 ENCOUNTER — Ambulatory Visit (HOSPITAL_COMMUNITY)
Admission: EM | Admit: 2022-04-11 | Discharge: 2022-04-11 | Disposition: A | Discharge status: Home / Self Care | Attending: Family Medicine | Admitting: Family Medicine

## 2022-04-11 DIAGNOSIS — M25531 Pain in right wrist: Secondary | ICD-10-CM

## 2022-04-11 DIAGNOSIS — S63501A Unspecified sprain of right wrist, initial encounter: Secondary | ICD-10-CM | POA: Diagnosis not present

## 2022-04-11 MED ORDER — DICLOFENAC SODIUM 75 MG PO TBEC: 75.0000 mg | DELAYED_RELEASE_TABLET | Freq: Two times a day (BID) | ORAL | 0 refills | Status: DC

## 2022-04-11 NOTE — ED Triage Notes (Signed)
Pt reports right wrist pain after being at work and hearing a "pop". States she went to open a mailbox and the mailbox was stuck.

## 2022-04-12 NOTE — ED Provider Notes (Signed)
[facility]   [id number] 04/11/22 Arrival Time: 1902  ASSESSMENT & PLAN:  1. Right wrist pain   2. Sprain of right wrist, initial encounter    I have personally viewed the imaging studies ordered this visit. No acute bony abnormalities. Discussed radiologist report with pt.  DG Wrist Complete Right  Result Date: 04/11/2022 CLINICAL DATA:  Right wrist pain, right wrist injury EXAM: RIGHT WRIST - COMPLETE 3+ VIEW COMPARISON:  None Available. FINDINGS: There is mild widening of the radioulnar joint space which may reflect an underlying ligamentous injury. No associated fracture. Otherwise normal alignment of the right wrist. Remaining joint spaces are preserved. Soft tissues are unremarkable. IMPRESSION: Mild widening of the radioulnar joint space which may reflect an underlying ligamentous injury. Correlation with clinical examination would be helpful for confirmation. Electronically Signed   By: Helyn Numbers M.D.   On: 04/11/2022 20:27    Work note provided.  Begin: Discharge Medication List as of 04/11/2022  8:44 PM     START taking these medications   Details  diclofenac (VOLTAREN) 75 MG EC tablet Take 1 tablet (75 mg total) by mouth 2 (two) times daily., Starting Tue 04/11/2022, Normal       Applied wrist brace/thumb spica.  Recommend:  Follow-up Information     Schedule an appointment as soon as possible for a visit  with [facility].   Why: [address] [place], [place] [address]  [phone]                Reviewed expectations re: course of current medical issues. Questions answered. Outlined signs and symptoms indicating need for more acute intervention. Patient verbalized understanding. After Visit Summary given.  SUBJECTIVE: History from: patient. Shelly [name] is a 31 y.o. female who reports Pt reports right wrist pain after being at work and hearing a "pop". States she went to open a mailbox and the mailbox was  stuck. Continuing wrist pain here. No extremity sensation changes or weakness. No tx PTA.  Past Surgical History:  Procedure Laterality Date   CESAREAN SECTION N/A 10/17/2019   Procedure: CESAREAN SECTION;  Surgeon: Harold Hedge, MD;  Location: [facility];  Service: Obstetrics;  Laterality: N/A;   TONSILLECTOMY        OBJECTIVE:  Vitals:   04/11/22 1948  BP: 122/68  Pulse: 76  Resp: 17  Temp: 98 F (36.7 C)  TempSrc: Oral  SpO2: 100%    General appearance: alert; no distress HEENT: [place]; AT Neck: supple with FROM Resp: unlabored respirations Extremities: RUE: warm with well perfused appearance; poorly localized mild tenderness over right radial wrist; without gross deformities; swelling: none; bruising: none; wrist ROM: normal, with discomfort CV: brisk extremity capillary refill of RUE; 2+ radial pulse of RUE. Skin: warm and dry; no visible rashes Neurologic: gait normal; normal sensation and strength of RUE Psychological: alert and cooperative; normal mood and affect  Imaging: DG Wrist Complete Right  Result Date: 04/11/2022 CLINICAL DATA:  Right wrist pain, right wrist injury EXAM: RIGHT WRIST - COMPLETE 3+ VIEW COMPARISON:  None Available. FINDINGS: There is mild widening of the radioulnar joint space which may reflect an underlying ligamentous injury. No associated fracture. Otherwise normal alignment of the right wrist. Remaining joint spaces are preserved. Soft tissues are unremarkable. IMPRESSION: Mild widening of the radioulnar joint space which may reflect an underlying ligamentous injury. Correlation with clinical examination would be helpful for confirmation. Electronically Signed   By: Helyn Numbers  M.D.   On: 04/11/2022 20:27      No Known Allergies  Past Medical History:  Diagnosis Date   Asthma    Headache(784.0)    Social History   Socioeconomic History   Marital status: Married    Spouse name: Theron Arista   Number of children: 1   Years of education:  college   Highest education level: Not on file  Occupational History   Occupation: [employer]    Employer: [employer]  Tobacco Use   Smoking status: Never   Smokeless tobacco: Never  Vaping Use   Vaping Use: Never used  Substance and Sexual Activity   Alcohol use: Not Currently   Drug use: No   Sexual activity: Yes    Birth control/protection: Pill  Other Topics Concern   Not on file  Social History Narrative   Not on file   Social Determinants of Health   Financial Resource Strain: Not on file  Food Insecurity: Not on file  Transportation Needs: Not on file  Physical Activity: Not on file  Stress: Not on file  Social Connections: Not on file   Family History  Problem Relation Age of Onset   High blood pressure Mother    Healthy Father    Heart disease Maternal Aunt    Heart disease Maternal Uncle    Diabetes Maternal Grandmother    Heart disease Maternal Grandmother    Cancer Maternal Grandfather    Cancer Paternal Grandfather    Past Surgical History:  Procedure Laterality Date   CESAREAN SECTION N/A 10/17/2019   Procedure: CESAREAN SECTION;  Surgeon: Harold Hedge, MD;  Location: [facility];  Service: Obstetrics;  Laterality: N/A;   Greig Right, MD 04/12/22 [phone]

## 2022-05-11 LAB — HM PAP SMEAR: HM Pap smear: NEGATIVE

## 2023-09-20 ENCOUNTER — Ambulatory Visit: Payer: Self-pay | Admitting: Internal Medicine

## 2024-01-17 ENCOUNTER — Encounter: Payer: Self-pay | Admitting: Family Medicine

## 2024-01-17 ENCOUNTER — Ambulatory Visit (INDEPENDENT_AMBULATORY_CARE_PROVIDER_SITE_OTHER): Admitting: Family Medicine

## 2024-01-17 VITALS — BP 116/74 | HR 75 | Temp 98.1°F | Ht 62.0 in | Wt 248.0 lb

## 2024-01-17 DIAGNOSIS — Z7689 Persons encountering health services in other specified circumstances: Secondary | ICD-10-CM

## 2024-01-17 DIAGNOSIS — Z6841 Body Mass Index (BMI) 40.0 and over, adult: Secondary | ICD-10-CM | POA: Diagnosis not present

## 2024-01-17 DIAGNOSIS — E66813 Obesity, class 3: Secondary | ICD-10-CM

## 2024-01-17 DIAGNOSIS — Z0000 Encounter for general adult medical examination without abnormal findings: Secondary | ICD-10-CM

## 2024-01-17 NOTE — Patient Instructions (Signed)
-  It was nice to meet you and look forward to taking care of you. -Physical exam completed. -Ordered labs. Office will call with lab results and will be available on MyChart.  -Follow up up in 1 year for physical.

## 2024-01-17 NOTE — Progress Notes (Signed)
 New Patient Office Visit  Subjective   Patient ID: Shelly [name], female    DOB: Feb 03, 1991  Age: 33 y.o. MRN: [id number]  CC:  Chief Complaint  Patient presents with   Establish Care    HPI Shelly [name] presents to establish care with new provider.  Patients previous primary care provider: [facility]. Last seen more than 3 years ago.   Specialist: [facility]-Dr. Finis Hugger  Eye exam: Within the last year Dental: Does not receive regular dental care Exercise: Walk at least 3 times a week, 20-30 minutes; does weight training at home 2-3 times a week  Diet: General with low carbohydrate diet  Patient has no concerns.   Outpatient Encounter Medications as of 01/17/2024  Medication Sig   acetaminophen  (TYLENOL ) 325 MG tablet Take 2 tablets (650 mg total) by mouth every 6 (six) hours as needed for mild pain (temperature > 101.5.).   albuterol  (VENTOLIN  HFA) 108 (90 Base) MCG/ACT inhaler Inhale 2 puffs into the lungs every 6 (six) hours as needed for wheezing or shortness of breath.   Prenatal Vit-Fe Fumarate-FA (PRENATAL MULTIVITAMIN) TABS tablet Take 1 tablet by mouth daily at 12 noon.   [DISCONTINUED] diclofenac  (VOLTAREN ) 75 MG EC tablet Take 1 tablet (75 mg total) by mouth 2 (two) times daily.   [DISCONTINUED] ibuprofen  (ADVIL ) 600 MG tablet Take 1 tablet (600 mg total) by mouth every 6 (six) hours as needed for moderate pain.   [DISCONTINUED] oxyCODONE  (OXY IR/ROXICODONE ) 5 MG immediate release tablet Take 1 tablet (5 mg total) by mouth every 6 (six) hours as needed for moderate pain.   No facility-administered encounter medications on file as of 01/17/2024.    Past Medical History:  Diagnosis Date   Asthma    Headache(784.0)     Past Surgical History:  Procedure Laterality Date   CESAREAN SECTION N/A 10/17/2019   Procedure: CESAREAN SECTION;  Surgeon: Thora Flint, MD;  Location: [facility] LD ORS;  Service: Obstetrics;  Laterality: N/A;    TONSILLECTOMY      Family History  Problem Relation Age of Onset   High blood pressure Mother    Healthy Father    Heart disease Maternal Aunt    Heart disease Maternal Uncle    Diabetes Maternal Grandmother    Heart disease Maternal Grandmother    Cancer Maternal Grandfather     Social History   Socioeconomic History   Marital status: Married    Spouse name: Donata Fryer   Number of children: 2   Years of education: college   Highest education level: Associate degree: occupational, Scientist, product/process development, or vocational program  Occupational History   Occupation: [place]  [employer] Paediatric nurse: [employer]  Tobacco Use   Smoking status: Never   Smokeless tobacco: Never  Vaping Use   Vaping status: Never Used  Substance and Sexual Activity   Alcohol use: Yes    Comment: 1-2 times a month   Drug use: No   Sexual activity: Yes    Birth control/protection: None  Other Topics Concern   Not on file  Social History Narrative   Not on file   Social Drivers of Health   Financial Resource Strain: Low Risk  (01/16/2024)   Overall Financial Resource Strain (CARDIA)    Difficulty of Paying Living Expenses: Not hard at all  Food Insecurity: No Food Insecurity (01/16/2024)   Hunger Vital Sign    Worried About Running Out of Food in the Last Year:  Never true    Ran Out of Food in the Last Year: Never true  Transportation Needs: No Transportation Needs (01/16/2024)   PRAPARE - Administrator, Civil Service (Medical): No    Lack of Transportation (Non-Medical): No  Physical Activity: Insufficiently Active (01/16/2024)   Exercise Vital Sign    Days of Exercise per Week: 3 days    Minutes of Exercise per Session: 20 min  Stress: Stress Concern Present (01/16/2024)   [employer] of Occupational Health - Occupational Stress Questionnaire    Feeling of Stress : To some extent  Social Connections: Moderately Isolated (01/17/2024)   Social Connection and Isolation Panel    Frequency of  Communication with Friends and Family: More than three times a week    Frequency of Social Gatherings with Friends and Family: Once a week    Attends Religious Services: Never    Database administrator or Organizations: No    Attends Banker Meetings: Never    Marital Status: Married  Catering manager Violence: Not At Risk (01/17/2024)   Humiliation, Afraid, Rape, and Kick questionnaire    Fear of Current or Ex-Partner: No    Emotionally Abused: No    Physically Abused: No    Sexually Abused: No    ROS See HPI above    Objective  BP 116/74   Pulse 75   Temp 98.1 F (36.7 C) (Oral)   Ht 5' 2 (1.575 m)   Wt 248 lb (112.5 kg)   LMP 12/13/2023 (Exact Date)   SpO2 99%   Breastfeeding No   BMI 45.36 kg/m   Physical Exam Vitals reviewed.  Constitutional:      General: She is not in acute distress.    Appearance: Normal appearance. She is obese. She is not ill-appearing or toxic-appearing.  HENT:     Head: Normocephalic and atraumatic.     Right Ear: Tympanic membrane, ear canal and external ear normal. There is no impacted cerumen.     Left Ear: Tympanic membrane, ear canal and external ear normal. There is no impacted cerumen.     Mouth/Throat:     Mouth: Mucous membranes are moist.     Pharynx: Oropharynx is clear. No oropharyngeal exudate or posterior oropharyngeal erythema.   Eyes:     General:        Right eye: No discharge.        Left eye: No discharge.     Conjunctiva/sclera: Conjunctivae normal.     Pupils: Pupils are equal, round, and reactive to light.   Neck:     Thyroid: No thyromegaly.   Cardiovascular:     Rate and Rhythm: Normal rate and regular rhythm.     Heart sounds: Normal heart sounds. No murmur heard.    No friction rub. No gallop.  Pulmonary:     Effort: Pulmonary effort is normal. No respiratory distress.     Breath sounds: Normal breath sounds.  Abdominal:     General: Abdomen is flat. Bowel sounds are normal. There is no  distension.     Palpations: Abdomen is soft. There is no mass.     Tenderness: There is no abdominal tenderness. There is no right CVA tenderness or left CVA tenderness.   Musculoskeletal:        General: Normal range of motion.     Cervical back: Normal range of motion. No tenderness.     Thoracic back: No tenderness.     Lumbar  back: No tenderness.     Right lower leg: No edema.     Left lower leg: No edema.  Lymphadenopathy:     Head:     Right side of head: No submental or submandibular adenopathy.     Left side of head: No submental or submandibular adenopathy.     Cervical: No cervical adenopathy.   Skin:    General: Skin is warm and dry.   Neurological:     General: No focal deficit present.     Mental Status: She is alert and oriented to person, place, and time. Mental status is at baseline.     Gait: Gait normal.   Psychiatric:        Mood and Affect: Mood normal.        Behavior: Behavior normal.        Thought Content: Thought content normal.        Judgment: Judgment normal.      Assessment & Plan:  Annual physical exam  Class 3 severe obesity due to excess calories without serious comorbidity with body mass index (BMI) of 45.0 to 49.9 in adult -     CBC with Differential/Platelet -     Comprehensive metabolic panel with GFR -     Hemoglobin A1c -     Lipid panel  Encounter to establish care   1.Review health maintenance: -Cervical cancer screening: Request records from GYN, within 4 years  -Covid booster: Declines  -Hep C: during pregnancy, will look through GYN records. -PNA vaccine: Not had  2.Physical exam completed. 3.Ordered labs (CBC, CMP, A1c, and lipids-not fasting) based on BMI.  4.Follow up in 1 year for physical.   Return in about 1 year (around 01/16/2025) for physical.   Ashelyn Mccravy, NP

## 2024-01-18 ENCOUNTER — Ambulatory Visit: Payer: Self-pay | Admitting: Family Medicine

## 2024-01-18 LAB — COMPREHENSIVE METABOLIC PANEL WITH GFR
AG Ratio: 1.4 (calc) (ref 1.0–2.5)
ALT: 9 U/L (ref 6–29)
AST: 10 U/L (ref 10–30)
Albumin: 4 g/dL (ref 3.6–5.1)
Alkaline phosphatase (APISO): 66 U/L (ref 31–125)
BUN: 10 mg/dL (ref 7–25)
CO2: 24 mmol/L (ref 20–32)
Calcium: 9.6 mg/dL (ref 8.6–10.2)
Chloride: 105 mmol/L (ref 98–110)
Creat: 0.87 mg/dL (ref 0.50–0.97)
Globulin: 2.8 g/dL (ref 1.9–3.7)
Glucose, Bld: 100 mg/dL — ABNORMAL HIGH (ref 65–99)
Potassium: 4.1 mmol/L (ref 3.5–5.3)
Sodium: 139 mmol/L (ref 135–146)
Total Bilirubin: 0.5 mg/dL (ref 0.2–1.2)
Total Protein: 6.8 g/dL (ref 6.1–8.1)
eGFR: 91 mL/min/{1.73_m2} (ref >=60)

## 2024-01-18 LAB — CBC WITH DIFFERENTIAL/PLATELET
Absolute Lymphocytes: 2050 {cells}/uL (ref 850–3900)
Absolute Monocytes: 449 {cells}/uL (ref 200–950)
Basophils Absolute: 40 {cells}/uL (ref 0–200)
Basophils Relative: 0.6 %
Eosinophils Absolute: 107 {cells}/uL (ref 15–500)
Eosinophils Relative: 1.6 %
HCT: 42.1 % (ref 35.0–45.0)
Hemoglobin: 13.8 g/dL (ref 11.7–15.5)
MCH: 27.7 pg (ref 27.0–33.0)
MCHC: 32.8 g/dL (ref 32.0–36.0)
MCV: 84.5 fL (ref 80.0–100.0)
MPV: 9.7 fL (ref 7.5–12.5)
Monocytes Relative: 6.7 %
Neutro Abs: 4054 {cells}/uL (ref 1500–7800)
Neutrophils Relative %: 60.5 %
Platelets: 267 10*3/uL (ref 140–400)
RBC: 4.98 10*6/uL (ref 3.80–5.10)
RDW: 13.2 % (ref 11.0–15.0)
Total Lymphocyte: 30.6 %
WBC: 6.7 10*3/uL (ref 3.8–10.8)

## 2024-01-18 LAB — LIPID PANEL
Cholesterol: 189 mg/dL (ref <200)
HDL: 45 mg/dL — ABNORMAL LOW (ref >=50)
LDL Cholesterol (Calc): 119 mg/dL — ABNORMAL HIGH
Non-HDL Cholesterol (Calc): 144 mg/dL — ABNORMAL HIGH (ref <130)
Total CHOL/HDL Ratio: 4.2 (calc) (ref <5.0)
Triglycerides: 137 mg/dL (ref <150)

## 2024-01-18 LAB — HEMOGLOBIN A1C
Hgb A1c MFr Bld: 5 % (ref <5.7)
Mean Plasma Glucose: 97 mg/dL
eAG (mmol/L): 5.4 mmol/L
